# Patient Record
Sex: Male | Born: 1995 | Hispanic: No | Marital: Single
Health system: Midwestern US, Community
[De-identification: ages and names within clinical notes are randomized; demographics above are authoritative.]

## PROBLEM LIST (undated history)

## (undated) DIAGNOSIS — T7840XA Allergy, unspecified, initial encounter: Secondary | ICD-10-CM

## (undated) HISTORY — DX: Allergy, unspecified, initial encounter: T78.40XA

---
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
## 2015-10-21 IMAGING — CR DG LUMBAR SPINE 2-3V
3 series · 3 of 3 positions shown · non-contrast
Comparison: None.

CLINICAL DATA: Low back pain after fall.

EXAM:
LUMBAR SPINE - 2-3 VIEW

[AP]
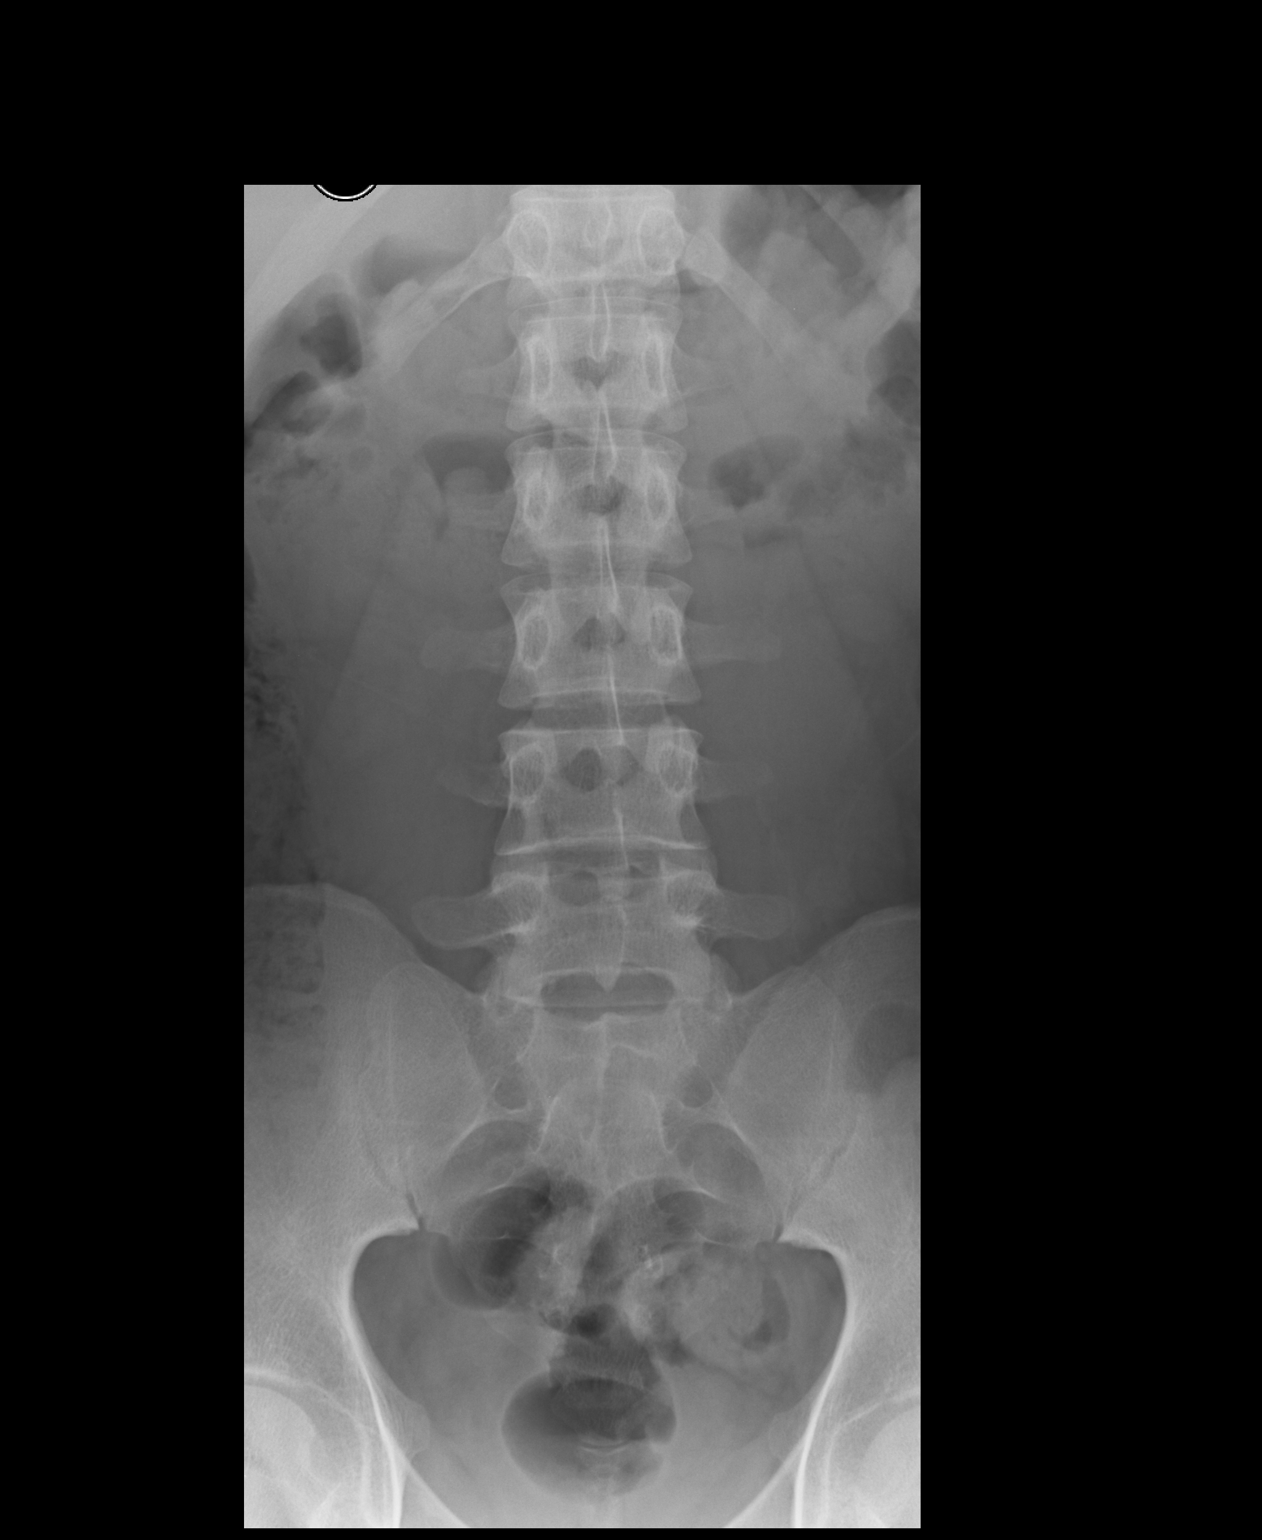

[lateral]
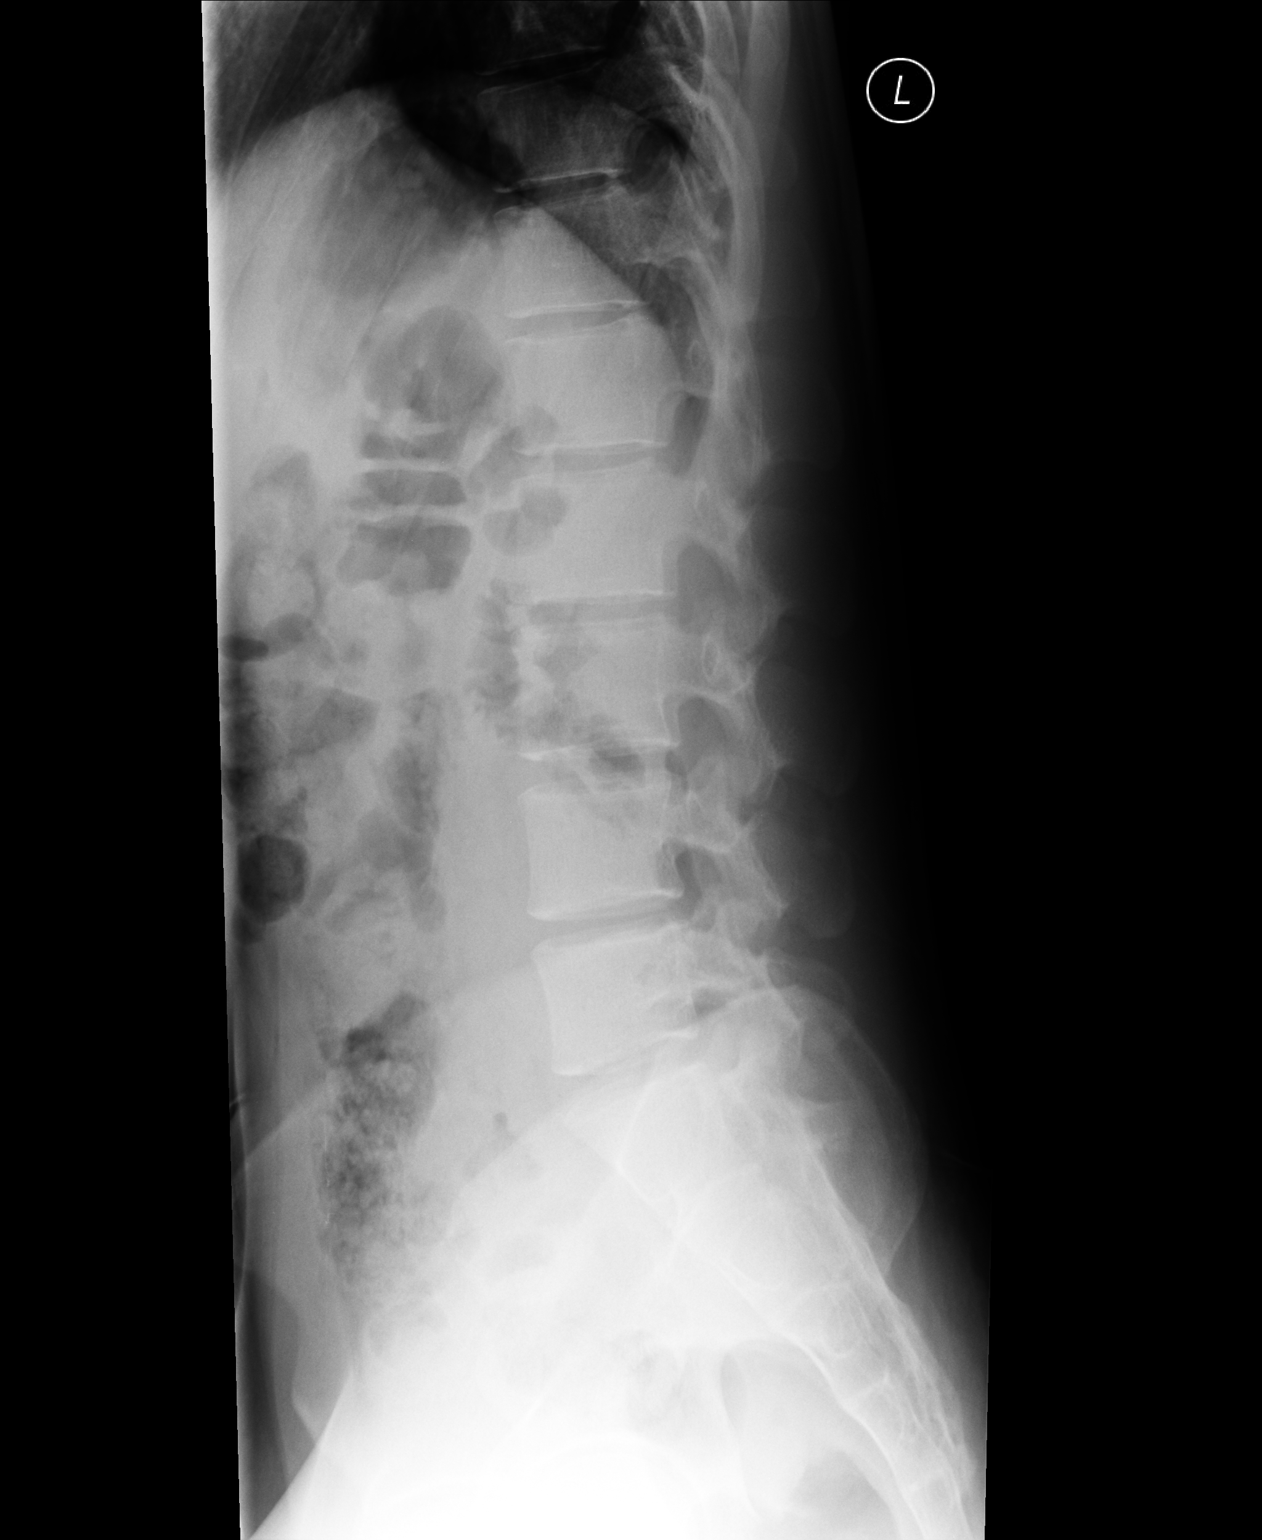

[l5 s1]
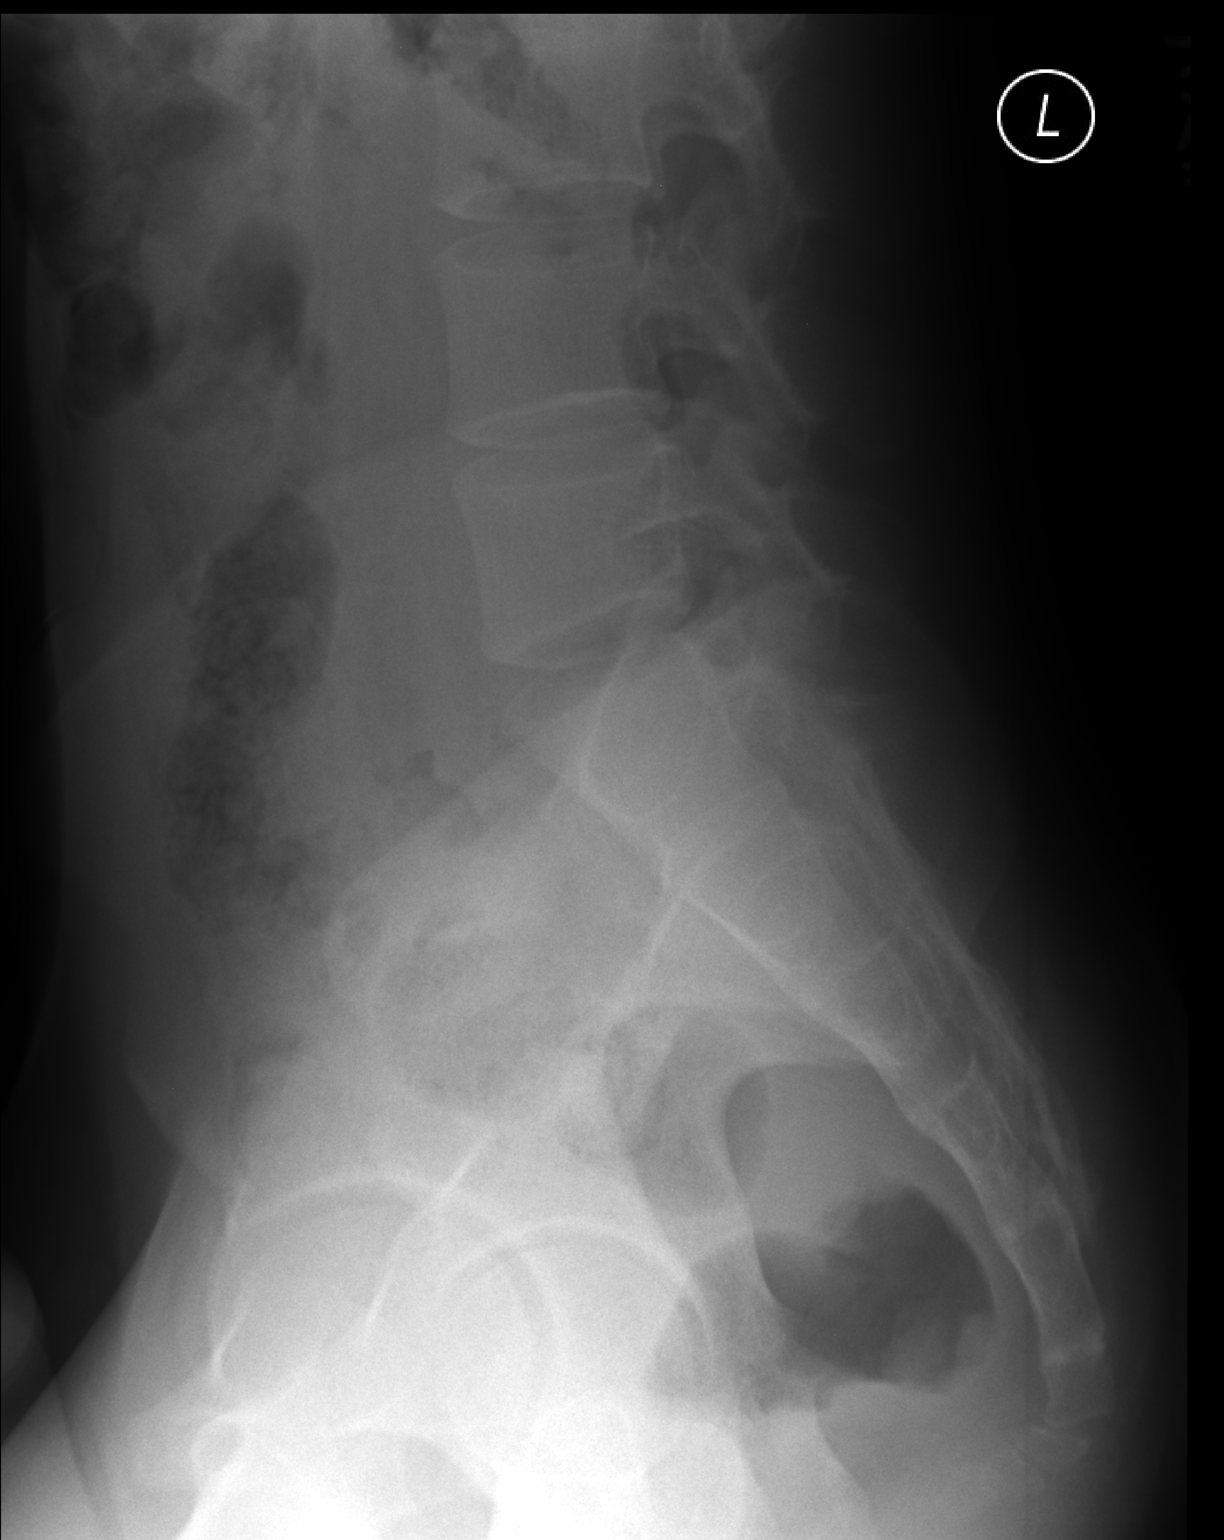

[3 of 3 positions shown; findings below may reference images not displayed]

FINDINGS: There is no evidence of lumbar spine fracture. Alignment is normal.
Intervertebral disc spaces are maintained.
IMPRESSION: Normal lumbar spine.

## 2018-06-26 ENCOUNTER — Inpatient Hospital Stay
Admit: 2018-06-26 | Discharge: 2018-07-05 | Disposition: A | Discharge status: Home / Self Care | Payer: PRIVATE HEALTH INSURANCE | Source: Other Acute Inpatient Hospital | Attending: Psychiatry | Admitting: Psychiatry

## 2018-06-26 DIAGNOSIS — F259 Schizoaffective disorder, unspecified: Secondary | ICD-10-CM

## 2018-06-26 MED ORDER — BENZTROPINE 2 MG TAB
2 mg | Freq: Two times a day (BID) | ORAL | Status: DC | PRN
Start: 2018-06-26 — End: 2018-07-05

## 2018-06-26 MED ORDER — TRAZODONE 50 MG TAB
50 mg | Freq: Every evening | ORAL | Status: DC | PRN
Start: 2018-06-26 — End: 2018-07-05
  Administered 2018-06-27 – 2018-07-05 (×3): via ORAL

## 2018-06-26 MED ORDER — HYDROXYZINE 25 MG TAB
25 mg | Freq: Four times a day (QID) | ORAL | Status: DC | PRN
Start: 2018-06-26 — End: 2018-07-05
  Administered 2018-06-26 – 2018-07-03 (×3): via ORAL

## 2018-06-26 MED ORDER — NICOTINE 21 MG/24 HR DAILY PATCH
21 mg/24 hr | Freq: Every day | TRANSDERMAL | Status: DC | PRN
Start: 2018-06-26 — End: 2018-07-05

## 2018-06-26 MED ORDER — BENZTROPINE 1 MG/ML IJ SOLN
1 mg/mL | Freq: Two times a day (BID) | INTRAMUSCULAR | Status: DC | PRN
Start: 2018-06-26 — End: 2018-07-05

## 2018-06-26 MED ORDER — ACETAMINOPHEN 325 MG TABLET
325 mg | ORAL | Status: DC | PRN
Start: 2018-06-26 — End: 2018-07-05
  Administered 2018-07-04: 23:00:00 via ORAL

## 2018-06-26 MED ORDER — MAGNESIUM HYDROXIDE 400 MG/5 ML ORAL SUSP
400 mg/5 mL | Freq: Every day | ORAL | Status: DC | PRN
Start: 2018-06-26 — End: 2018-07-05

## 2018-06-26 MED ORDER — WATER FOR INJECTION, STERILE INJECTION
20 mg/mL (final conc.) | Freq: Two times a day (BID) | INTRAMUSCULAR | Status: DC | PRN
Start: 2018-06-26 — End: 2018-07-05

## 2018-06-26 MED ORDER — LORAZEPAM 2 MG/ML IJ SOLN
2 mg/mL | INTRAMUSCULAR | Status: DC | PRN
Start: 2018-06-26 — End: 2018-07-05

## 2018-06-26 MED ORDER — QUETIAPINE 25 MG TAB
25 mg | Freq: Every evening | ORAL | Status: DC
Start: 2018-06-26 — End: 2018-06-28
  Administered 2018-06-27 – 2018-06-28 (×2): via ORAL

## 2018-06-26 MED ORDER — OLANZAPINE 5 MG TAB
5 mg | Freq: Four times a day (QID) | ORAL | Status: DC | PRN
Start: 2018-06-26 — End: 2018-07-05

## 2018-06-26 MED FILL — HYDROXYZINE 25 MG TAB: 25 mg | ORAL | Qty: 2
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
## 2018-06-26 NOTE — Consults (Addendum)
Hospitalist Progress Note  Kenneth HumblesSergey Eliel Dudding, MD  Answering service: [phone] OR [phone] from in house phone  Cell: [phone]      Date of Service:  06/26/2018 0  NAME:  Kenneth Hill  DOB:  20-Oct-1996  MRN:  [id number]  Admission date: 06/26/2018  6:37 AM    Admission Summary:   Kenneth Hill 22 y.o. male with the h/o schizoaffective disorder admitted to inpatient psychiatric unit after he presented with agitation and paranoya.    Interval history / Subjective:     Pleasant. Not agitated on my exam. Fairly good historian. Denies any medical problems.  Afebrile. No dysuria, cough, abdominal discomfort.     Assessment & Plan:   Schizoaffective disorder.  -Management per psychiatry  No medical problems based on chart review, physical exam and history.    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     Physical Examination:         Constitutional:  No acute distress, cooperative, pleasant??   Resp:  CTA bilaterally. No wheezing/rhonchi/rales. No accessory muscle use   CV:  Regular rhythm, normal rate, no murmurs, gallops, rubs    GI:  Soft, non distended, non tender. normoactive bowel sounds, no hepatosplenomegaly        Data Review:     Will sign off. Please, call with questions.             Kenneth HumblesSergey Cannie Muckle, MD
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
## 2018-06-26 NOTE — H&P (Signed)
INITIAL PSYCHIATRIC EVALUATION          IDENTIFICATION:    Patient Name  Kenneth Kenneth   Date of Birth 05-26-1996   CSN [id number]   Medical Record Number  [id number]      Age  22 y.o.   PCP None   Admit date:  06/26/2018    [facility] Number  312/01  [facility]   Date of Service  06/26/2018            HISTORY         REASON FOR HOSPITALIZATION:  CC:  Pt admitted under a temporary detention order (TDO) with severe psychosis and proving to be an imminent danger to self and others.    HISTORY OF PRESENT ILLNESS:    The patient, Kenneth Kenneth, is a 22 y.o.  [place] male with a past psychiatric history significant for Psychosis, who presents at this time with complaints of (and/or evidence of) the following emotional symptoms: agitation, delusions, paranoid behavior, anxiety and psychosis.  Additional symptomatology include anxiety, fearfulness, poor concentration and problem with medication.  The above symptoms have been present for a week. These symptoms are of high severity. These symptoms are constant and intermittent/ fleeting in nature.  The patient's condition has been precipitated by multiple psychosocial stressors .  Patient's condition made worse by continued illicit drug use as well as treatment noncompliance. UDS: +MJ, Amphetamines; BAL=0.   Pt is a [place] male and just returned from [place]. He presents very distractible. He has been on various anti-psychotics in the past and refuses to start one on. Reluctantly agreed to a trial of Seroquel.     ALLERGIES:   Allergies   Allergen Reactions   ??? Aspirin Swelling      MEDICATIONS PRIOR TO ADMISSION:   No medications prior to admission.      PAST MEDICAL HISTORY:   No past medical history on file.No past surgical history on file.   SOCIAL HISTORY: Per SW note   Social History     Socioeconomic History   ??? Marital status: SINGLE     Spouse name: Not on file   ??? Number of children: Not on file   ??? Years of education: Not on file    ??? Highest education level: Not on file   Occupational History   ??? Not on file   Social Needs   ??? Financial resource strain: Not on file   ??? Food insecurity:     Worry: Not on file     Inability: Not on file   ??? Transportation needs:     Medical: Not on file     Non-medical: Not on file   Tobacco Use   ??? Smoking status: Not on file   Substance and Sexual Activity   ??? Alcohol use: Not on file   ??? Drug use: Not on file   ??? Sexual activity: Not on file   Lifestyle   ??? Physical activity:     Days per week: Not on file     Minutes per session: Not on file   ??? Stress: Not on file   Relationships   ??? Social connections:     Talks on phone: Not on file     Gets together: Not on file     Attends religious service: Not on file     Active member of club or organization: Not on file     Attends meetings of clubs or organizations: Not  on file     Relationship status: Not on file   ??? Intimate partner violence:     Fear of current or ex partner: Not on file     Emotionally abused: Not on file     Physically abused: Not on file     Forced sexual activity: Not on file   Other Topics Concern   ??? Not on file   Social History Narrative   ??? Not on file      FAMILY HISTORY: History reviewed. No pertinent family history.   No family history on file.    REVIEW OF SYSTEMS:   Psychological ROS: positive for - anxiety, concentration difficulties and sleep disturbances  negative for - disorientation, hallucinations or hostility  Pertinent items are noted in the History of Present Illness.  All other Systems reviewed and are considered negative.           MENTAL STATUS EXAM & VITALS     MENTAL STATUS EXAM (MSE):    MSE FINDINGS ARE WITHIN NORMAL LIMITS (WNL) UNLESS OTHERWISE STATED BELOW. ( ALL OF THE BELOW CATEGORIES OF THE MSE HAVE BEEN REVIEWED (reviewed 06/26/2018) AND UPDATED AS DEEMED APPROPRIATE )  General Presentation age appropriate and casually dressed, cooperative   Orientation oriented to time, place and person    Vital Signs  See below (reviewed 06/26/2018); Vital Signs (BP, Pulse, & Temp) are within normal limits if not listed below.   Gait and Station Stable/steady, no ataxia   Musculoskeletal System No extrapyramidal symptoms (EPS); no abnormal muscular movements or Tardive Dyskinesia (TD); muscle strength and tone are within normal limits   Language No aphasia or dysarthria   Speech:  hypoverbal   Thought Processes Illogical; normal rate of thoughts; poor abstract reasoning/computation   Thought Associations circumstantial   Thought Content paranoid delusions and preoccupations   Suicidal Ideations none   Homicidal Ideations none   Mood:  anxious    Affect:  sad   Memory recent  good   Memory remote:  good   Concentration/Attention:  distractable   Fund of Knowledge average   Insight:  limited   Reliability poor   Judgment:  limited          VITALS:     Patient Vitals for the past 24 hrs:   Temp Pulse Resp BP SpO2   06/26/18 0740 97.9 ??F (36.6 ??C) 100 16 100/60 96 %     Wt Readings from Last 3 Encounters:   06/26/18 63.5 kg (140 lb)     Temp Readings from Last 3 Encounters:   06/26/18 97.9 ??F (36.6 ??C)     BP Readings from Last 3 Encounters:   06/26/18 100/60     Pulse Readings from Last 3 Encounters:   06/26/18 100            DATA     LABORATORY DATA:  Labs Reviewed - No data to display  No results found for any previous visit.        RADIOLOGY REPORTS:  No results found for this or any previous visit.No results found.           MEDICATIONS       ALL MEDICATIONS  Current Facility-Administered Medications   Medication Dose Route Frequency   ??? ziprasidone (GEODON) 20 mg in sterile water (preservative free) 1 mL injection  20 mg IntraMUSCular BID PRN   ??? OLANZapine (ZyPREXA) tablet 5 mg  5 mg Oral Q6H PRN   ??? benztropine (COGENTIN)  tablet 2 mg  2 mg Oral BID PRN   ??? benztropine (COGENTIN) injection 2 mg  2 mg IntraMUSCular BID PRN   ??? LORazepam (ATIVAN) injection 2 mg  2 mg IntraMUSCular Q4H PRN    ??? acetaminophen (TYLENOL) tablet 650 mg  650 mg Oral Q4H PRN   ??? magnesium hydroxide (MILK OF MAGNESIA) 400 mg/5 mL oral suspension 30 mL  30 mL Oral DAILY PRN   ??? nicotine (NICODERM CQ) 21 mg/24 hr patch 1 Patch  1 Patch TransDERmal DAILY PRN   ??? hydrOXYzine HCl (ATARAX) tablet 50 mg  50 mg Oral Q6H PRN   ??? traZODone (DESYREL) tablet 50 mg  50 mg Oral QHS PRN   ??? QUEtiapine (SEROquel) tablet 50 mg  50 mg Oral QHS      SCHEDULED MEDICATIONS  Current Facility-Administered Medications   Medication Dose Route Frequency   ??? QUEtiapine (SEROquel) tablet 50 mg  50 mg Oral QHS              ASSESSMENT & PLAN        The patient, Kenneth Kenneth, is a 22 y.o.  male who presents at this time for treatment of the following diagnoses:  Patient Active Hospital Problem List:   Schizoaffective disorder (HCC) (06/26/2018)    Assessment: Presents distractible, denies current AVH, paranoid delusions reported at the time of ER eval.    Plan: Agreed to a trial of Seroquel 50 mg qhs          I will continue to monitor blood levels (Depakote, Tegretol, lithium, clozapine---a drug with a narrow therapeutic index= NTI) and associated labs for drug therapy implemented that require intense monitoring for toxicity as deemed appropriate based on current medication side effects and pharmacodynamically determined drug 1/2 lives.         A coordinated, multidisplinary treatment team (includes the nurse, unit pharmcist, Administrator) round was conducted for this initial evaluation with the patient present.     The following regarding medications was addressed during rounds with patient:   the risks and benefits of the proposed medication. The patient was given the opportunity to ask questions. Informed consent given to the use of the above medications.     I will continue to adjust psychiatric and non-psychiatric medications (see above "medication" section and orders section for details) as deemed  appropriate & based upon diagnoses and response to treatment.     I have reviewed admission (and previous/old) labs and medical tests in the EHR and or transferring hospital documents. I will continue to order blood tests/labs and diagnostic tests as deemed appropriate and review results as they become available (see orders for details).    I have reviewed old psychiatric and medical records available in the EHR. I Will order additional psychiatric records from other institutions to further elucidate the nature of patient's psychopathology and review once available.    I will gather additional collateral information from friends, family and o/p treatment team to further elucidate the nature of patient's psychopathology and baselline level of psychiatric functioning.      ESTIMATED LENGTH OF STAY:    4-5 days       STRENGTHS:  Access to housing/residential stability, Interpersonal/supportive relationships (family, friends, peers) and Awareness of Substance abuse issues  SIGNED:    Elby Showers, MD  06/26/2018
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
## 2018-06-26 NOTE — Consults (Signed)
Consults by  Kenneth Hill, Kenneth Ghrist, MD at 06/26/18 1101                Author: Holli Hill, Kenneth Supak, MD  Service: Internal Medicine  Author Type: Physician       Filed: 06/26/18 1544  Date of Service: 06/26/18 1101  Status: Addendum          Editor: Kenneth Hill, Kenneth Mccready, MD (Physician)          Related Notes: Original Note by Kenneth Hill, Kenneth Zellars, MD (Physician) filed at 06/26/18 1104            Consult Orders        1. IP CONSULT TO INTERNAL MEDICINE [[id number]][[id number]] ordered by Kenneth Hill, Kenneth L, MD at 06/26/18 [phone]                                                                                                      Hospitalist Progress  Note   Kenneth HumblesSergey Yazmen Briones, MD   Answering service: [phone] OR [phone] from in house phone   Cell: [phone]         Date of Service:  06/26/2018 0   NAME:  Kenneth Hill   DOB:  1996/05/02   MRN:  [id number]   Admission date: 06/26/2018  6:37 AM        Admission Summary:        Kenneth Hill 22 y.o.  male with the h/o schizoaffective disorder admitted to inpatient psychiatric unit after he presented with agitation and paranoya.      Interval history / Subjective:          Pleasant. Not agitated on my exam. Fairly good historian. Denies any medical problems.   Afebrile. No dysuria, cough, abdominal discomfort.          Assessment & Plan:     Schizoaffective disorder.   -Management per psychiatry   No medical problems based on chart review, physical exam and history.         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         Physical Examination:  Constitutional:   No acute distress, cooperative, pleasant??      Resp:   CTA bilaterally. No wheezing/rhonchi/rales. No accessory muscle use     CV:   Regular rhythm, normal rate, no murmurs, gallops, rubs      GI:   Soft, non distended, non tender. normoactive bowel sounds, no hepatosplenomegaly              Data Review:        Will sign off. Please, call with questions.              Kenneth Humbles, MD

## 2018-06-26 NOTE — H&P (Signed)
INITIAL PSYCHIATRIC EVALUATION            IDENTIFICATION:    Patient Name  Kenneth Hill   Date of Birth December 16, 1996   CSN [id number]   Medical Record Number  [id number]      Age  22 y.o.   PCP None   Admit date:  06/26/2018    Room Number  312/01  @ [facility]   Date of Service  06/26/2018            HISTORY         REASON FOR HOSPITALIZATION:  CC:  Pt admitted under a temporary detention order (TDO) with severe psychosis and proving to be an imminent danger to self and others.    HISTORY OF PRESENT ILLNESS:    The patient, Kenneth Hill, is a 22 y.o.  [place] male with a past psychiatric history significant for Psychosis, who presents at this time with complaints of (and/or evidence of) the following emotional symptoms: agitation, delusions, paranoid behavior, anxiety and psychosis.  Additional symptomatology include anxiety, fearfulness, poor concentration and problem with medication.  The above symptoms have been present for a week. These symptoms are of high severity. These symptoms are constant and intermittent/ fleeting in nature.  The patient's condition has been precipitated by multiple psychosocial stressors .  Patient's condition made worse by continued illicit drug use as well as treatment noncompliance. UDS: +MJ, Amphetamines; BAL=0.   Pt is a [place] and just returned from [place]. He presents very distractible. He has been on various anti-psychotics in the past and refuses to start one on. Reluctantly agreed to a trial of Seroquel.     ALLERGIES:   Allergies   Allergen Reactions   ??? Aspirin Swelling      MEDICATIONS PRIOR TO ADMISSION:   No medications prior to admission.      PAST MEDICAL HISTORY:   No past medical history on file.No past surgical history on file.   SOCIAL HISTORY: Per SW note   Social History     Socioeconomic History   ??? Marital status: SINGLE     Spouse name: Not on file   ??? Number of children: Not on file   ??? Years of education: Not on file   ??? Highest  education level: Not on file   Occupational History   ??? Not on file   Social Needs   ??? Financial resource strain: Not on file   ??? Food insecurity:     Worry: Not on file     Inability: Not on file   ??? Transportation needs:     Medical: Not on file     Non-medical: Not on file   Tobacco Use   ??? Smoking status: Not on file   Substance and Sexual Activity   ??? Alcohol use: Not on file   ??? Drug use: Not on file   ??? Sexual activity: Not on file   Lifestyle   ??? Physical activity:     Days per week: Not on file     Minutes per session: Not on file   ??? Stress: Not on file   Relationships   ??? Social connections:     Talks on phone: Not on file     Gets together: Not on file     Attends religious service: Not on file     Active member of club or organization: Not on file     Attends meetings of clubs or  organizations: Not on file     Relationship status: Not on file   ??? Intimate partner violence:     Fear of current or ex partner: Not on file     Emotionally abused: Not on file     Physically abused: Not on file     Forced sexual activity: Not on file   Other Topics Concern   ??? Not on file   Social History Narrative   ??? Not on file      FAMILY HISTORY: History reviewed. No pertinent family history.   No family history on file.    REVIEW OF SYSTEMS:   Psychological ROS: positive for - anxiety, concentration difficulties and sleep disturbances  negative for - disorientation, hallucinations or hostility  Pertinent items are noted in the History of Present Illness.  All other Systems reviewed and are considered negative.           MENTAL STATUS EXAM & VITALS     MENTAL STATUS EXAM (MSE):    MSE FINDINGS ARE WITHIN NORMAL LIMITS (WNL) UNLESS OTHERWISE STATED BELOW. ( ALL OF THE BELOW CATEGORIES OF THE MSE HAVE BEEN REVIEWED (reviewed 06/26/2018) AND UPDATED AS DEEMED APPROPRIATE )  General Presentation age appropriate and casually dressed, cooperative   Orientation oriented to time, place and person   Vital Signs  See below (reviewed  06/26/2018); Vital Signs (BP, Pulse, & Temp) are within normal limits if not listed below.   Gait and Station Stable/steady, no ataxia   Musculoskeletal System No extrapyramidal symptoms (EPS); no abnormal muscular movements or Tardive Dyskinesia (TD); muscle strength and tone are within normal limits   Language No aphasia or dysarthria   Speech:  hypoverbal   Thought Processes Illogical; normal rate of thoughts; poor abstract reasoning/computation   Thought Associations circumstantial   Thought Content paranoid delusions and preoccupations   Suicidal Ideations none   Homicidal Ideations none   Mood:  anxious    Affect:  sad   Memory recent  good   Memory remote:  good   Concentration/Attention:  distractable   Fund of Knowledge average   Insight:  limited   Reliability poor   Judgment:  limited          VITALS:     Patient Vitals for the past 24 hrs:   Temp Pulse Resp BP SpO2   06/26/18 0740 97.9 ??F (36.6 ??C) 100 16 100/60 96 %     Wt Readings from Last 3 Encounters:   06/26/18 63.5 kg (140 lb)     Temp Readings from Last 3 Encounters:   06/26/18 97.9 ??F (36.6 ??C)     BP Readings from Last 3 Encounters:   06/26/18 100/60     Pulse Readings from Last 3 Encounters:   06/26/18 100            DATA     LABORATORY DATA:  Labs Reviewed - No data to display  No results found for any previous visit.        RADIOLOGY REPORTS:  No results found for this or any previous visit.No results found.           MEDICATIONS       ALL MEDICATIONS  Current Facility-Administered Medications   Medication Dose Route Frequency   ??? ziprasidone (GEODON) 20 mg in sterile water (preservative free) 1 mL injection  20 mg IntraMUSCular BID PRN   ??? OLANZapine (ZyPREXA) tablet 5 mg  5 mg Oral Q6H PRN   ???  benztropine (COGENTIN) tablet 2 mg  2 mg Oral BID PRN   ??? benztropine (COGENTIN) injection 2 mg  2 mg IntraMUSCular BID PRN   ??? LORazepam (ATIVAN) injection 2 mg  2 mg IntraMUSCular Q4H PRN   ??? acetaminophen (TYLENOL) tablet 650 mg  650 mg Oral Q4H PRN    ??? magnesium hydroxide (MILK OF MAGNESIA) 400 mg/5 mL oral suspension 30 mL  30 mL Oral DAILY PRN   ??? nicotine (NICODERM CQ) 21 mg/24 hr patch 1 Patch  1 Patch TransDERmal DAILY PRN   ??? hydrOXYzine HCl (ATARAX) tablet 50 mg  50 mg Oral Q6H PRN   ??? traZODone (DESYREL) tablet 50 mg  50 mg Oral QHS PRN   ??? QUEtiapine (SEROquel) tablet 50 mg  50 mg Oral QHS      SCHEDULED MEDICATIONS  Current Facility-Administered Medications   Medication Dose Route Frequency   ??? QUEtiapine (SEROquel) tablet 50 mg  50 mg Oral QHS                ASSESSMENT & PLAN        The patient, Kenneth Hill, is a 22 y.o.  male who presents at this time for treatment of the following diagnoses:  Patient Active Hospital Problem List:   Schizoaffective disorder (HCC) (06/26/2018)    Assessment: Presents distractible, denies current AVH, paranoid delusions reported at the time of ER eval.    Plan: Agreed to a trial of Seroquel 50 mg qhs          I will continue to monitor blood levels (Depakote, Tegretol, lithium, clozapine---a drug with a narrow therapeutic index= NTI) and associated labs for drug therapy implemented that require intense monitoring for toxicity as deemed appropriate based on current medication side effects and pharmacodynamically determined drug 1/2 lives.         A coordinated, multidisplinary treatment team (includes the nurse, unit pharmcist, Administratorsocial worker and writer) round was conducted for this initial evaluation with the patient present.     The following regarding medications was addressed during rounds with patient:   the risks and benefits of the proposed medication. The patient was given the opportunity to ask questions. Informed consent given to the use of the above medications.     I will continue to adjust psychiatric and non-psychiatric medications (see above "medication" section and orders section for details) as deemed appropriate & based upon diagnoses and response to treatment.     I have reviewed admission (and  previous/old) labs and medical tests in the EHR and or transferring hospital documents. I will continue to order blood tests/labs and diagnostic tests as deemed appropriate and review results as they become available (see orders for details).    I have reviewed old psychiatric and medical records available in the EHR. I Will order additional psychiatric records from other institutions to further elucidate the nature of patient's psychopathology and review once available.    I will gather additional collateral information from friends, family and o/p treatment team to further elucidate the nature of patient's psychopathology and baselline level of psychiatric functioning.      ESTIMATED LENGTH OF STAY:    4-5 days       STRENGTHS:  Access to housing/residential stability, Interpersonal/supportive relationships (family, friends, peers) and Awareness of Substance abuse issues  SIGNED:    Elby Showers, MD  06/26/2018

## 2018-06-27 LAB — LIPID PANEL
CHOL/HDL Ratio: 4.4 (ref 0.0–5.0)
Chol/HDL Ratio: 4.4 (ref 0.0–5.0)
Cholesterol, Total: 264 MG/DL — ABNORMAL HIGH (ref <200)
Cholesterol, total: 264 MG/DL — ABNORMAL HIGH (ref <200)
HDL Cholesterol: 60 MG/DL
HDL: 60 MG/DL
LDL Calculated: 183.2 MG/DL — ABNORMAL HIGH (ref 0–100)
LDL, calculated: 183.2 MG/DL — ABNORMAL HIGH (ref 0–100)
Triglyceride: 104 MG/DL (ref <150)
Triglycerides: 104 MG/DL (ref <150)
VLDL Cholesterol Calculated: 20.8 MG/DL
VLDL, calculated: 20.8 MG/DL

## 2018-06-27 LAB — GLUCOSE, FASTING
GLUCOSE, FASTING,GF: 98 MG/DL (ref 65–100)
Glucose: 98 MG/DL (ref 65–100)

## 2018-06-27 LAB — TSH 3RD GENERATION
TSH: 6.43 u[IU]/mL — ABNORMAL HIGH (ref 0.36–3.74)
TSH: 6.43 u[IU]/mL — ABNORMAL HIGH (ref 0.36–3.74)

## 2018-06-27 MED FILL — QUETIAPINE 25 MG TAB: 25 mg | ORAL | Qty: 2

## 2018-06-27 MED FILL — HYDROXYZINE 25 MG TAB: 25 mg | ORAL | Qty: 2

## 2018-06-27 MED FILL — NICOTINE 21 MG/24 HR DAILY PATCH: 21 mg/24 hr | TRANSDERMAL | Qty: 1

## 2018-06-27 MED FILL — TRAZODONE 50 MG TAB: 50 mg | ORAL | Qty: 1

## 2018-06-27 NOTE — Progress Notes (Addendum)
0700  Report received from Emma RN    1025  Pt has been out in the milieu.  He ate breakfast.  He had some interactions with others.  He does not know his mother's phone number.  Pt tried to call his brother but stated the number has been disconnected.   Pt denies mental health issues.  He had stated last evening that the reason he was hospitalized in [place] was because he was eating raw liver.  He stated "it is good for you, ya know."  Pt stated they thought he was crazy.    Pt stated his mother thinks he is "crazy" too.   Pt stated he did not sleep last night.  He was informed he slept 4 hours he disputed this stating he was awake all night.      1329  Pt seen by psychiatry.  He has been somewhat withdrawn today.      1822  Pt has been in and out of his room.  Limited interactions with others,  Pt spoke to his brother on the phone.  He asked about his mother coming to the hearing tomorrow.  He was deferred to treatment team.

## 2018-06-27 NOTE — Behavioral Health Treatment Team (Signed)
Patient is calm and cooperative. He appears anxious. Poor eye contact during interactions. Isolative to his room. Describes his day as being "fine". He denies any current feelings of SI or HI. Denies having any auditory or visual hallucinations. He was hesitant to take his bedtime medications. When writer explained to him it could help with sleep, he was more willing to accept his scheduled dose of Seroquel. Currently resting in bed with his eyes closed. Q 15 minute safety checks continue. Tasha Washington BSN, RN

## 2018-06-27 NOTE — Behavioral Health Treatment Team (Signed)
19:00 - Received report from Regina, RN.    19:30 - Patient up in dayroom interacting with peers. Patient denies SI/HI and AH/VH. Patient states he is doing "okay". Will continue to monitor for safety per unit policy.    22:00 - Patient is compliant with scheduled medication. He discussed his seroquel with a peer and later came back to med room and stated he does not want to take it any more.  Writer instructed patient to speak with his doctor.    00:00 - Patient up wanting to change rooms. Informed patient we had no other rooms available. Patient stated he could not go to sleep that the seroquel messed with his head.    00:20 - Patient given atarax for anxiety.    02:00 - Patient resting in bed with eyes opened.    04:00 - Patient resting quietly in bed with eyes closed. NAD noted.    06:00 - Hourly rounds were done and continue. Patient slept approximately 4 hours this shift.

## 2018-06-27 NOTE — Behavioral Health Treatment Team (Signed)
PSYCHIATRIC PROGRESS NOTE          IDENTIFICATION:    Patient Name  Kenneth Hill   Date of Birth Apr 26, 1996   CSN [id number]   Medical Record Number  [id number]      Age  22 y.o.   PCP None   Admit date:  06/26/2018    Room Number  312/01  @ [facility]   Date of Service  06/27/2018            HISTORY         REASON FOR HOSPITALIZATION:  CC:  Pt admitted under a temporary detention order (TDO) with severe psychosis and proving to be an imminent danger to self and others.    HISTORY OF PRESENT ILLNESS:    The patient, Kenneth Hill, is a 22 y.o.  [place] male with a past psychiatric history significant for Psychosis, who presents at this time with complaints of (and/or evidence of) the following emotional symptoms: agitation, delusions, paranoid behavior, anxiety and psychosis.  Additional symptomatology include anxiety, fearfulness, poor concentration and problem with medication.  The above symptoms have been present for a week. These symptoms are of high severity. These symptoms are constant and intermittent/ fleeting in nature.  The patient's condition has been precipitated by multiple psychosocial stressors .  Patient's condition made worse by continued illicit drug use as well as treatment noncompliance. UDS: +MJ, Amphetamines; BAL=0.   Pt is a [place] male and just returned from [place]. He presents very distractible. He has been on various anti-psychotics in the past and refuses to start one on. Reluctantly agreed to a trial of Seroquel.  7/7-Appears less anxious today but is very doubtful about taking Psych meds. Still has delusional thoughts. Slept 4 hrs. Prn used Atarax. Mom visited yesterday.   ALLERGIES:   Allergies   Allergen Reactions   ??? Aspirin Swelling      MEDICATIONS PRIOR TO ADMISSION:   No medications prior to admission.      PAST MEDICAL HISTORY:   No past medical history on file.No past surgical history on file.   SOCIAL HISTORY: Per SW note   Social History      Socioeconomic History   ??? Marital status: SINGLE     Spouse name: Not on file   ??? Number of children: Not on file   ??? Years of education: Not on file   ??? Highest education level: Not on file   Occupational History   ??? Not on file   Social Needs   ??? Financial resource strain: Not on file   ??? Food insecurity:     Worry: Not on file     Inability: Not on file   ??? Transportation needs:     Medical: Not on file     Non-medical: Not on file   Tobacco Use   ??? Smoking status: Not on file   Substance and Sexual Activity   ??? Alcohol use: Not on file   ??? Drug use: Not on file   ??? Sexual activity: Not on file   Lifestyle   ??? Physical activity:     Days per week: Not on file     Minutes per session: Not on file   ??? Stress: Not on file   Relationships   ??? Social connections:     Talks on phone: Not on file     Gets together: Not on file     Attends religious service: Not on file  Active member of club or organization: Not on file     Attends meetings of clubs or organizations: Not on file     Relationship status: Not on file   ??? Intimate partner violence:     Fear of current or ex partner: Not on file     Emotionally abused: Not on file     Physically abused: Not on file     Forced sexual activity: Not on file   Other Topics Concern   ??? Not on file   Social History Narrative   ??? Not on file      FAMILY HISTORY: History reviewed. No pertinent family history.   No family history on file.    REVIEW OF SYSTEMS:   Psychological ROS: positive for - anxiety, concentration difficulties and sleep disturbances  negative for - disorientation, hallucinations or hostility  Pertinent items are noted in the History of Present Illness.  All other Systems reviewed and are considered negative.           MENTAL STATUS EXAM & VITALS     MENTAL STATUS EXAM (MSE):    MSE FINDINGS ARE WITHIN NORMAL LIMITS (WNL) UNLESS OTHERWISE STATED BELOW. ( ALL OF THE BELOW CATEGORIES OF THE MSE HAVE BEEN REVIEWED (reviewed  06/27/2018) AND UPDATED AS DEEMED APPROPRIATE )  General Presentation age appropriate and casually dressed, cooperative   Orientation oriented to time, place and person   Vital Signs  See below (reviewed 06/27/2018); Vital Signs (BP, Pulse, & Temp) are within normal limits if not listed below.   Gait and Station Stable/steady, no ataxia   Musculoskeletal System No extrapyramidal symptoms (EPS); no abnormal muscular movements or Tardive Dyskinesia (TD); muscle strength and tone are within normal limits   Language No aphasia or dysarthria   Speech:  hypoverbal   Thought Processes Illogical; normal rate of thoughts; poor abstract reasoning/computation   Thought Associations circumstantial   Thought Content paranoid delusions and preoccupations   Suicidal Ideations none   Homicidal Ideations none   Mood:  anxious    Affect:  sad   Memory recent  good   Memory remote:  good   Concentration/Attention:  distractable   Fund of Knowledge average   Insight:  limited   Reliability poor   Judgment:  limited          VITALS:     Patient Vitals for the past 24 hrs:   Temp Pulse Resp BP SpO2   06/27/18 1015 99 ??F (37.2 ??C) 83 18 114/67 99 %   06/26/18 1930 98.1 ??F (36.7 ??C) 81 18 133/80 99 %     Wt Readings from Last 3 Encounters:   06/26/18 63.5 kg (140 lb)     Temp Readings from Last 3 Encounters:   06/27/18 99 ??F (37.2 ??C)     BP Readings from Last 3 Encounters:   06/27/18 114/67     Pulse Readings from Last 3 Encounters:   06/27/18 83            DATA     LABORATORY DATA:  Labs Reviewed   TSH 3RD GENERATION - Abnormal; Notable for the following components:       Result Value    TSH 6.43 (*)     All other components within normal limits   LIPID PANEL - Abnormal; Notable for the following components:    Cholesterol, total 264 (*)     LDL, calculated 183.2 (*)     All other components within  normal limits   GLUCOSE, FASTING     Admission on 06/26/2018   Component Date Value Ref Range Status    ??? Glucose 06/27/2018 98  65 - 100 MG/DL Final   ??? TSH 16/10/960407/06/2018 6.43* 0.36 - 3.74 uIU/mL Final   ??? LIPID PROFILE 06/27/2018        Final   ??? Cholesterol, total 06/27/2018 264* <200 MG/DL Final   ??? Triglyceride 06/27/2018 104  <150 MG/DL Final   ??? HDL Cholesterol 06/27/2018 60  MG/DL Final   ??? LDL, calculated 06/27/2018 183.2* 0 - 100 MG/DL Final   ??? VLDL, calculated 06/27/2018 20.8  MG/DL Final   ??? CHOL/HDL Ratio 06/27/2018 4.4  0.0 - 5.0   Final        RADIOLOGY REPORTS:  No results found for this or any previous visit.No results found.           MEDICATIONS       ALL MEDICATIONS  Current Facility-Administered Medications   Medication Dose Route Frequency   ??? ziprasidone (GEODON) 20 mg in sterile water (preservative free) 1 mL injection  20 mg IntraMUSCular BID PRN   ??? OLANZapine (ZyPREXA) tablet 5 mg  5 mg Oral Q6H PRN   ??? benztropine (COGENTIN) tablet 2 mg  2 mg Oral BID PRN   ??? benztropine (COGENTIN) injection 2 mg  2 mg IntraMUSCular BID PRN   ??? LORazepam (ATIVAN) injection 2 mg  2 mg IntraMUSCular Q4H PRN   ??? acetaminophen (TYLENOL) tablet 650 mg  650 mg Oral Q4H PRN   ??? magnesium hydroxide (MILK OF MAGNESIA) 400 mg/5 mL oral suspension 30 mL  30 mL Oral DAILY PRN   ??? nicotine (NICODERM CQ) 21 mg/24 hr patch 1 Patch  1 Patch TransDERmal DAILY PRN   ??? hydrOXYzine HCl (ATARAX) tablet 50 mg  50 mg Oral Q6H PRN   ??? traZODone (DESYREL) tablet 50 mg  50 mg Oral QHS PRN   ??? QUEtiapine (SEROquel) tablet 50 mg  50 mg Oral QHS      SCHEDULED MEDICATIONS  Current Facility-Administered Medications   Medication Dose Route Frequency   ??? QUEtiapine (SEROquel) tablet 50 mg  50 mg Oral QHS              ASSESSMENT & PLAN        The patient, Kenneth Hill, is a 22 y.o.  male who presents at this time for treatment of the following diagnoses:  Patient Active Hospital Problem List:   Schizoaffective disorder (HCC) (06/26/2018)    Assessment: Presents distractible, denies current AVH, paranoid  delusions reported at the time of ER eval.    Plan: Agreed to a trial of Seroquel 50 mg qhs          I will continue to monitor blood levels (Depakote, Tegretol, lithium, clozapine---a drug with a narrow therapeutic index= NTI) and associated labs for drug therapy implemented that require intense monitoring for toxicity as deemed appropriate based on current medication side effects and pharmacodynamically determined drug 1/2 lives.         A coordinated, multidisplinary treatment team (includes the nurse, unit pharmcist, Administratorsocial worker and writer) round was conducted for this initial evaluation with the patient present.     The following regarding medications was addressed during rounds with patient:   the risks and benefits of the proposed medication. The patient was given the opportunity to ask questions. Informed consent given to the use of the above medications.  I will continue to adjust psychiatric and non-psychiatric medications (see above "medication" section and orders section for details) as deemed appropriate & based upon diagnoses and response to treatment.     I have reviewed admission (and previous/old) labs and medical tests in the EHR and or transferring hospital documents. I will continue to order blood tests/labs and diagnostic tests as deemed appropriate and review results as they become available (see orders for details).    I have reviewed old psychiatric and medical records available in the EHR. I Will order additional psychiatric records from other institutions to further elucidate the nature of patient's psychopathology and review once available.    I will gather additional collateral information from friends, family and o/p treatment team to further elucidate the nature of patient's psychopathology and baselline level of psychiatric functioning.      ESTIMATED LENGTH OF STAY:    4-5 days       STRENGTHS:  Access to housing/residential stability, Interpersonal/supportive  relationships (family, friends, peers) and Awareness of Substance abuse issues                                        SIGNED:    Elby Showers, MD  06/27/2018

## 2018-06-27 NOTE — Progress Notes (Signed)
 0700  Report received from [facility]    1025  Pt has been out in the milieu.  He ate breakfast.  He had some interactions with others.  He does not know his mother's phone number.  Pt tried to call his brother but stated the number has been disconnected.   Pt denies mental health issues.  He had stated last evening that the reason he was hospitalized in [place] was because he was eating raw liver.  He stated it is good for you, ya know.  Pt stated they thought he was crazy.    Pt stated his mother thinks he is crazy too.   Pt stated he did not sleep last night.  He was informed he slept 4 hours he disputed this stating he was awake all night.      1329  Pt seen by psychiatry.  He has been somewhat withdrawn today.      1822  Pt has been in and out of his room.  Limited interactions with others,  Pt spoke to his brother on the phone.  He asked about his mother coming to the hearing tomorrow.  He was deferred to treatment team.

## 2018-06-27 NOTE — Behavioral Health Treatment Team (Signed)
 Patient is calm and cooperative. He appears anxious. Poor eye contact during interactions. Isolative to his room. Describes his day as being fine. He denies any current feelings of SI or HI. Denies having any auditory or visual hallucinations. He was hesitant to take his bedtime medications. When writer explained to him it could help with sleep, he was more willing to accept his scheduled dose of Seroquel. Currently resting in bed with his eyes closed. Q 15 minute safety checks continue. Tasha Washington  BSN, RN

## 2018-06-27 NOTE — Behavioral Health Treatment Team (Signed)
 19:00 - Received report from [place], [place].    19:30 - Patient up in dayroom interacting with peers. Patient denies SI/HI and AH/VH. Patient states he is doing okay. Will continue to monitor for safety per unit policy.    22:00 - Patient is compliant with scheduled medication. He discussed his seroquel with a peer and later came back to med room and stated he does not want to take it any more.  Writer instructed patient to speak with his doctor.    00:00 - Patient up wanting to change rooms. Informed patient we had no other rooms available. Patient stated he could not go to sleep that the seroquel messed with his head.    00:20 - Patient given atarax for anxiety.    02:00 - Patient resting in bed with eyes opened.    04:00 - Patient resting quietly in bed with eyes closed. NAD noted.    06:00 - Hourly rounds were done and continue. Patient slept approximately 4 hours this shift.

## 2018-06-27 NOTE — Behavioral Health Treatment Team (Signed)
PSYCHIATRIC PROGRESS NOTE            IDENTIFICATION:    Patient Name  Kenneth Hill   Date of Birth January 05, 1996   CSN [id number]   Medical Record Number  [id number]      Age  22 y.o.   PCP None   Admit date:  06/26/2018    Room Number  312/01  @ [facility]   Date of Service  06/27/2018            HISTORY         REASON FOR HOSPITALIZATION:  CC:  Pt admitted under a temporary detention order (TDO) with severe psychosis and proving to be an imminent danger to self and others.    HISTORY OF PRESENT ILLNESS:    The patient, Kenneth Hill, is a 22 y.o.  [place] male with a past psychiatric history significant for Psychosis, who presents at this time with complaints of (and/or evidence of) the following emotional symptoms: agitation, delusions, paranoid behavior, anxiety and psychosis.  Additional symptomatology include anxiety, fearfulness, poor concentration and problem with medication.  The above symptoms have been present for a week. These symptoms are of high severity. These symptoms are constant and intermittent/ fleeting in nature.  The patient's condition has been precipitated by multiple psychosocial stressors .  Patient's condition made worse by continued illicit drug use as well as treatment noncompliance. UDS: +MJ, Amphetamines; BAL=0.   Pt is a [place] and just returned from [place]. He presents very distractible. He has been on various anti-psychotics in the past and refuses to start one on. Reluctantly agreed to a trial of Seroquel.  7/7-Appears less anxious today but is very doubtful about taking Psych meds. Still has delusional thoughts. Slept 4 hrs. Prn used Atarax. Mom visited yesterday.   ALLERGIES:   Allergies   Allergen Reactions   ??? Aspirin Swelling      MEDICATIONS PRIOR TO ADMISSION:   No medications prior to admission.      PAST MEDICAL HISTORY:   No past medical history on file.No past surgical history on file.   SOCIAL HISTORY: Per SW note   Social History      Socioeconomic History   ??? Marital status: SINGLE     Spouse name: Not on file   ??? Number of children: Not on file   ??? Years of education: Not on file   ??? Highest education level: Not on file   Occupational History   ??? Not on file   Social Needs   ??? Financial resource strain: Not on file   ??? Food insecurity:     Worry: Not on file     Inability: Not on file   ??? Transportation needs:     Medical: Not on file     Non-medical: Not on file   Tobacco Use   ??? Smoking status: Not on file   Substance and Sexual Activity   ??? Alcohol use: Not on file   ??? Drug use: Not on file   ??? Sexual activity: Not on file   Lifestyle   ??? Physical activity:     Days per week: Not on file     Minutes per session: Not on file   ??? Stress: Not on file   Relationships   ??? Social connections:     Talks on phone: Not on file     Gets together: Not on file     Attends religious service: Not  on file     Active member of club or organization: Not on file     Attends meetings of clubs or organizations: Not on file     Relationship status: Not on file   ??? Intimate partner violence:     Fear of current or ex partner: Not on file     Emotionally abused: Not on file     Physically abused: Not on file     Forced sexual activity: Not on file   Other Topics Concern   ??? Not on file   Social History Narrative   ??? Not on file      FAMILY HISTORY: History reviewed. No pertinent family history.   No family history on file.    REVIEW OF SYSTEMS:   Psychological ROS: positive for - anxiety, concentration difficulties and sleep disturbances  negative for - disorientation, hallucinations or hostility  Pertinent items are noted in the History of Present Illness.  All other Systems reviewed and are considered negative.           MENTAL STATUS EXAM & VITALS     MENTAL STATUS EXAM (MSE):    MSE FINDINGS ARE WITHIN NORMAL LIMITS (WNL) UNLESS OTHERWISE STATED BELOW. ( ALL OF THE BELOW CATEGORIES OF THE MSE HAVE BEEN REVIEWED (reviewed 06/27/2018) AND UPDATED AS DEEMED  APPROPRIATE )  General Presentation age appropriate and casually dressed, cooperative   Orientation oriented to time, place and person   Vital Signs  See below (reviewed 06/27/2018); Vital Signs (BP, Pulse, & Temp) are within normal limits if not listed below.   Gait and Station Stable/steady, no ataxia   Musculoskeletal System No extrapyramidal symptoms (EPS); no abnormal muscular movements or Tardive Dyskinesia (TD); muscle strength and tone are within normal limits   Language No aphasia or dysarthria   Speech:  hypoverbal   Thought Processes Illogical; normal rate of thoughts; poor abstract reasoning/computation   Thought Associations circumstantial   Thought Content paranoid delusions and preoccupations   Suicidal Ideations none   Homicidal Ideations none   Mood:  anxious    Affect:  sad   Memory recent  good   Memory remote:  good   Concentration/Attention:  distractable   Fund of Knowledge average   Insight:  limited   Reliability poor   Judgment:  limited          VITALS:     Patient Vitals for the past 24 hrs:   Temp Pulse Resp BP SpO2   06/27/18 1015 99 ??F (37.2 ??C) 83 18 114/67 99 %   06/26/18 1930 98.1 ??F (36.7 ??C) 81 18 133/80 99 %     Wt Readings from Last 3 Encounters:   06/26/18 63.5 kg (140 lb)     Temp Readings from Last 3 Encounters:   06/27/18 99 ??F (37.2 ??C)     BP Readings from Last 3 Encounters:   06/27/18 114/67     Pulse Readings from Last 3 Encounters:   06/27/18 83            DATA     LABORATORY DATA:  Labs Reviewed   TSH 3RD GENERATION - Abnormal; Notable for the following components:       Result Value    TSH 6.43 (*)     All other components within normal limits   LIPID PANEL - Abnormal; Notable for the following components:    Cholesterol, total 264 (*)     LDL, calculated 183.2 (*)  All other components within normal limits   GLUCOSE, FASTING     Admission on 06/26/2018   Component Date Value Ref Range Status   ??? Glucose 06/27/2018 98  65 - 100 MG/DL Final   ??? TSH 16/09/9603 6.43*  0.36 - 3.74 uIU/mL Final   ??? LIPID PROFILE 06/27/2018        Final   ??? Cholesterol, total 06/27/2018 264* <200 MG/DL Final   ??? Triglyceride 06/27/2018 104  <150 MG/DL Final   ??? HDL Cholesterol 06/27/2018 60  MG/DL Final   ??? LDL, calculated 06/27/2018 183.2* 0 - 100 MG/DL Final   ??? VLDL, calculated 06/27/2018 20.8  MG/DL Final   ??? CHOL/HDL Ratio 06/27/2018 4.4  0.0 - 5.0   Final        RADIOLOGY REPORTS:  No results found for this or any previous visit.No results found.           MEDICATIONS       ALL MEDICATIONS  Current Facility-Administered Medications   Medication Dose Route Frequency   ??? ziprasidone (GEODON) 20 mg in sterile water (preservative free) 1 mL injection  20 mg IntraMUSCular BID PRN   ??? OLANZapine (ZyPREXA) tablet 5 mg  5 mg Oral Q6H PRN   ??? benztropine (COGENTIN) tablet 2 mg  2 mg Oral BID PRN   ??? benztropine (COGENTIN) injection 2 mg  2 mg IntraMUSCular BID PRN   ??? LORazepam (ATIVAN) injection 2 mg  2 mg IntraMUSCular Q4H PRN   ??? acetaminophen (TYLENOL) tablet 650 mg  650 mg Oral Q4H PRN   ??? magnesium hydroxide (MILK OF MAGNESIA) 400 mg/5 mL oral suspension 30 mL  30 mL Oral DAILY PRN   ??? nicotine (NICODERM CQ) 21 mg/24 hr patch 1 Patch  1 Patch TransDERmal DAILY PRN   ??? hydrOXYzine HCl (ATARAX) tablet 50 mg  50 mg Oral Q6H PRN   ??? traZODone (DESYREL) tablet 50 mg  50 mg Oral QHS PRN   ??? QUEtiapine (SEROquel) tablet 50 mg  50 mg Oral QHS      SCHEDULED MEDICATIONS  Current Facility-Administered Medications   Medication Dose Route Frequency   ??? QUEtiapine (SEROquel) tablet 50 mg  50 mg Oral QHS                ASSESSMENT & PLAN        The patient, Kenneth Hill, is a 22 y.o.  male who presents at this time for treatment of the following diagnoses:  Patient Active Hospital Problem List:   Schizoaffective disorder (HCC) (06/26/2018)    Assessment: Presents distractible, denies current AVH, paranoid delusions reported at the time of ER eval.    Plan: Agreed to a trial of Seroquel 50 mg qhs          I will  continue to monitor blood levels (Depakote, Tegretol, lithium, clozapine---a drug with a narrow therapeutic index= NTI) and associated labs for drug therapy implemented that require intense monitoring for toxicity as deemed appropriate based on current medication side effects and pharmacodynamically determined drug 1/2 lives.         A coordinated, multidisplinary treatment team (includes the nurse, unit pharmcist, Administrator) round was conducted for this initial evaluation with the patient present.     The following regarding medications was addressed during rounds with patient:   the risks and benefits of the proposed medication. The patient was given the opportunity to ask questions. Informed consent given to the use of  the above medications.     I will continue to adjust psychiatric and non-psychiatric medications (see above "medication" section and orders section for details) as deemed appropriate & based upon diagnoses and response to treatment.     I have reviewed admission (and previous/old) labs and medical tests in the EHR and or transferring hospital documents. I will continue to order blood tests/labs and diagnostic tests as deemed appropriate and review results as they become available (see orders for details).    I have reviewed old psychiatric and medical records available in the EHR. I Will order additional psychiatric records from other institutions to further elucidate the nature of patient's psychopathology and review once available.    I will gather additional collateral information from friends, family and o/p treatment team to further elucidate the nature of patient's psychopathology and baselline level of psychiatric functioning.      ESTIMATED LENGTH OF STAY:    4-5 days       STRENGTHS:  Access to housing/residential stability, Interpersonal/supportive relationships (family, friends, peers) and Awareness of Substance abuse issues                                        SIGNED:     Elby Showers, MD  06/27/2018

## 2018-06-28 MED ORDER — QUETIAPINE 100 MG TAB
100 mg | Freq: Every evening | ORAL | Status: DC
Start: 2018-06-28 — End: 2018-06-29
  Administered 2018-06-29: 01:00:00 via ORAL

## 2018-06-28 MED FILL — QUETIAPINE 25 MG TAB: 25 mg | ORAL | Qty: 2

## 2018-06-28 NOTE — Behavioral Health Treatment Team (Signed)
PSYCHOSOCIAL ASSESSMENT  :Patient identifying info:  Kenneth Hill is a 22 y.o., male admitted 06/26/2018  6:37 AM     Presenting problem and precipitating factors: Pt was admitted under a TDO status due to psychosis - was paranoid and grabbed a weapon to protect himself from visual hallucinations. Pt was visibly under influence of drugs but unable to communicate what was used.  Pt reported he "dabbles" in a lot of drugs.  Pt shared with the treatment team that he smokes MJ regularly and smoked "diamond" about a week ago that he thought was MJ.  Pt was positive for amphetamines and diamond is  street slang for meth.       Mental status assessment:Social Work - Pt was seen in treatment team this morning. Pt is alert and oriented.  Pt denies SI/HI.  Pt's mood is euthymic, affect is mood congruent.  Pt's thought process is logical.  Pt's insight and judgment is poor, reliability is fair.  Social work department will continue to coordinate discharge plans.     Strengths: Verbalizes concerns and has family support.     Collateral information:  Awaiting for Hieu Kerman - pt's brother to return call.  ROI signed. [phone] prescreening family reports pt exhibiting delusional behaviors for some time. Pt went to [place] in Spring 2019 for treatment and was placed on Zyprexa in April 2019 but was sleeping excessively and took him off the medication.   Family reports that pt could not     Current psychiatric /substance abuse providers and contact info: None.    Previous psychiatric/substance abuse providers and response to treatment: None.     Family history of mental illness or substance abuse:     Substance abuse history:  UDS + MJ, Amphetamines   BAL=0  Per reports he has a history of DUIs and does not have a license as a result.   Social History     Tobacco Use   ??? Smoking status: Not on file   Substance Use Topics   ??? Alcohol use: Not on file       History of biomedical complications associated with substance abuse:  Unknown    Patient's current acceptance of treatment or motivation for change: Signed treatment plan.     Family constellation: Pt is single and does not have children    Is significant other involved? N/A.    Describe support system: Supportive parents and brother.     Describe living arrangements and home environment:  Pt lives with his mother, stepfather and brother.     Health issues:   Hospital Problems  Never Reviewed          Codes Class Noted POA    * (Principal) Schizoaffective disorder (HCC) ICD-10-CM: F25.9  ICD-9-CM: 295.70  06/26/2018 Unknown              Trauma history:  None reported     Legal issues:  Multiple drinking under influence charges.     History of military service: None    Financial status: Family    Religious/cultural factors: None expressed at this time.    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     Have you been licensed as a health care professional (current or expired): No    Leisure and recreation preferences: video games and exercising.     Describe coping skills: Ineffectual       Alison Flinn  06/28/2018

## 2018-06-28 NOTE — Behavioral Health Treatment Team (Signed)
PSYCHIATRIC PROGRESS NOTE          IDENTIFICATION:    Patient Name  Kenneth Hill   Date of Birth [birth year]   CSN [id number]   Medical Record Number  [id number]      Age  22 y.o.   PCP None   Admit date:  06/26/2018    Room Number  312/01  @ [facility]   Date of Service  06/28/2018            HISTORY         REASON FOR HOSPITALIZATION:  CC:  Pt admitted under a temporary detention order (TDO) with severe psychosis and proving to be an imminent danger to self and others.    HISTORY OF PRESENT ILLNESS:    The patient, Kenneth Hill, is a 22 y.o.  [place] male with a past psychiatric history significant for Psychosis, who presents at this time with complaints of (and/or evidence of) the following emotional symptoms: agitation, delusions, paranoid behavior, anxiety and psychosis.  Additional symptomatology include anxiety, fearfulness, poor concentration and problem with medication.  The above symptoms have been present for a week. These symptoms are of high severity. These symptoms are constant and intermittent/ fleeting in nature.  The patient's condition has been precipitated by multiple psychosocial stressors .  Patient's condition made worse by continued illicit drug use as well as treatment noncompliance. UDS: +MJ, Amphetamines; BAL=0.   Pt is a [place] and just returned from [place]. He presents very distractible. He has been on various anti-psychotics in the past and refuses to start one on. Reluctantly agreed to a trial of Seroquel.  7/7-Appears less anxious today but is very doubtful about taking Psych meds. Still has delusional thoughts. Slept 4 hrs. Prn used Atarax. Mom visited yesterday.  7/8- patient has been visible, continues to be grossly disorganized. Patient with ongoing delusions per nursing; tolerating medication thus far. Denies SI/HI/AVH on interview. Endorses MJ use.     ALLERGIES:   Allergies   Allergen Reactions   ??? Aspirin Swelling       MEDICATIONS PRIOR TO ADMISSION:   No medications prior to admission.      PAST MEDICAL HISTORY:   No past medical history on file.No past surgical history on file.   SOCIAL HISTORY: Per SW note   Social History     Socioeconomic History   ??? Marital status: SINGLE     Spouse name: Not on file   ??? Number of children: Not on file   ??? Years of education: Not on file   ??? Highest education level: Not on file   Occupational History   ??? Not on file   Social Needs   ??? Financial resource strain: Not on file   ??? Food insecurity:     Worry: Not on file     Inability: Not on file   ??? Transportation needs:     Medical: Not on file     Non-medical: Not on file   Tobacco Use   ??? Smoking status: Not on file   Substance and Sexual Activity   ??? Alcohol use: Not on file   ??? Drug use: Not on file   ??? Sexual activity: Not on file   Lifestyle   ??? Physical activity:     Days per week: Not on file     Minutes per session: Not on file   ??? Stress: Not on file   Relationships   ??? Social  connections:     Talks on phone: Not on file     Gets together: Not on file     Attends religious service: Not on file     Active member of club or organization: Not on file     Attends meetings of clubs or organizations: Not on file     Relationship status: Not on file   ??? Intimate partner violence:     Fear of current or ex partner: Not on file     Emotionally abused: Not on file     Physically abused: Not on file     Forced sexual activity: Not on file   Other Topics Concern   ??? Not on file   Social History Narrative   ??? Not on file      FAMILY HISTORY: History reviewed. No pertinent family history.   No family history on file.    REVIEW OF SYSTEMS:   Psychological ROS: positive for - anxiety, concentration difficulties and sleep disturbances  negative for - disorientation, hallucinations or hostility  Pertinent items are noted in the History of Present Illness.  All other Systems reviewed and are considered negative.           MENTAL STATUS EXAM & VITALS      MENTAL STATUS EXAM (MSE):    MSE FINDINGS ARE WITHIN NORMAL LIMITS (WNL) UNLESS OTHERWISE STATED BELOW. ( ALL OF THE BELOW CATEGORIES OF THE MSE HAVE BEEN REVIEWED (reviewed 06/28/2018) AND UPDATED AS DEEMED APPROPRIATE )  General Presentation age appropriate and casually dressed, cooperative   Orientation oriented to time, place and person   Vital Signs  See below (reviewed 06/28/2018); Vital Signs (BP, Pulse, & Temp) are within normal limits if not listed below.   Gait and Station Stable/steady, no ataxia   Musculoskeletal System No extrapyramidal symptoms (EPS); no abnormal muscular movements or Tardive Dyskinesia (TD); muscle strength and tone are within normal limits   Language No aphasia or dysarthria   Speech:  hypoverbal   Thought Processes Illogical; normal rate of thoughts; poor abstract reasoning/computation   Thought Associations circumstantial   Thought Content paranoid delusions and preoccupations   Suicidal Ideations none   Homicidal Ideations none   Mood:  anxious    Affect:  sad   Memory recent  good   Memory remote:  good   Concentration/Attention:  distractable   Fund of Knowledge average   Insight:  limited   Reliability poor   Judgment:  limited          VITALS:     Patient Vitals for the past 24 hrs:   Temp Pulse Resp BP SpO2   06/28/18 2035 98 ??F (36.7 ??C) 60 16 110/72 99 %   06/28/18 0816 97.9 ??F (36.6 ??C) 79 16 97/80 100 %     Wt Readings from Last 3 Encounters:   06/26/18 63.5 kg (140 lb)     Temp Readings from Last 3 Encounters:   06/28/18 98 ??F (36.7 ??C)     BP Readings from Last 3 Encounters:   06/28/18 110/72     Pulse Readings from Last 3 Encounters:   06/28/18 60            DATA     LABORATORY DATA:  Labs Reviewed   TSH 3RD GENERATION - Abnormal; Notable for the following components:       Result Value    TSH 6.43 (*)     All other components within normal limits  LIPID PANEL - Abnormal; Notable for the following components:    Cholesterol, total 264 (*)      LDL, calculated 183.2 (*)     All other components within normal limits   GLUCOSE, FASTING     Admission on 06/26/2018   Component Date Value Ref Range Status   ??? Glucose 06/27/2018 98  65 - 100 MG/DL Final   ??? TSH 16/09/9603 6.43* 0.36 - 3.74 uIU/mL Final   ??? LIPID PROFILE 06/27/2018        Final   ??? Cholesterol, total 06/27/2018 264* <200 MG/DL Final   ??? Triglyceride 06/27/2018 104  <150 MG/DL Final   ??? HDL Cholesterol 06/27/2018 60  MG/DL Final   ??? LDL, calculated 06/27/2018 183.2* 0 - 100 MG/DL Final   ??? VLDL, calculated 06/27/2018 20.8  MG/DL Final   ??? CHOL/HDL Ratio 06/27/2018 4.4  0.0 - 5.0   Final        RADIOLOGY REPORTS:  No results found for this or any previous visit.No results found.           MEDICATIONS       ALL MEDICATIONS  Current Facility-Administered Medications   Medication Dose Route Frequency   ??? QUEtiapine (SEROquel) tablet 100 mg  100 mg Oral QHS   ??? ziprasidone (GEODON) 20 mg in sterile water (preservative free) 1 mL injection  20 mg IntraMUSCular BID PRN   ??? OLANZapine (ZyPREXA) tablet 5 mg  5 mg Oral Q6H PRN   ??? benztropine (COGENTIN) tablet 2 mg  2 mg Oral BID PRN   ??? benztropine (COGENTIN) injection 2 mg  2 mg IntraMUSCular BID PRN   ??? LORazepam (ATIVAN) injection 2 mg  2 mg IntraMUSCular Q4H PRN   ??? acetaminophen (TYLENOL) tablet 650 mg  650 mg Oral Q4H PRN   ??? magnesium hydroxide (MILK OF MAGNESIA) 400 mg/5 mL oral suspension 30 mL  30 mL Oral DAILY PRN   ??? nicotine (NICODERM CQ) 21 mg/24 hr patch 1 Patch  1 Patch TransDERmal DAILY PRN   ??? hydrOXYzine HCl (ATARAX) tablet 50 mg  50 mg Oral Q6H PRN   ??? traZODone (DESYREL) tablet 50 mg  50 mg Oral QHS PRN      SCHEDULED MEDICATIONS  Current Facility-Administered Medications   Medication Dose Route Frequency   ??? QUEtiapine (SEROquel) tablet 100 mg  100 mg Oral QHS              ASSESSMENT & PLAN        The patient, Kenneth Hill, is a 21 y.o.  male who presents at this time for treatment of the following diagnoses:   Patient Active Hospital Problem List:   Schizoaffective disorder (HCC) (06/26/2018)    Assessment: Presents distractible, denies current AVH, paranoid delusions reported at the time of ER eval.    Plan: INCREASE Seroquel to 100 mg QHS for psychosis         A coordinated, multidisplinary treatment team (includes the nurse, unit pharmcist, Administrator) round was conducted for this initial evaluation with the patient present.     The following regarding medications was addressed during rounds with patient:   the risks and benefits of the proposed medication. The patient was given the opportunity to ask questions. Informed consent given to the use of the above medications.     I will continue to adjust psychiatric and non-psychiatric medications (see above "medication" section and orders section for details) as deemed appropriate & based  upon diagnoses and response to treatment.     I have reviewed admission (and previous/old) labs and medical tests in the EHR and or transferring hospital documents. I will continue to order blood tests/labs and diagnostic tests as deemed appropriate and review results as they become available (see orders for details).    I have reviewed old psychiatric and medical records available in the EHR. I Will order additional psychiatric records from other institutions to further elucidate the nature of patient's psychopathology and review once available.    I will gather additional collateral information from friends, family and o/p treatment team to further elucidate the nature of patient's psychopathology and baselline level of psychiatric functioning.      ESTIMATED LENGTH OF STAY:    4-5 days       STRENGTHS:  Access to housing/residential stability, Interpersonal/supportive relationships (family, friends, peers) and Awareness of Substance abuse issues                                        SIGNED:    Hiram Gashsitadinma L Travarus Trudo, MD  06/28/2018

## 2018-06-28 NOTE — Behavioral Health Treatment Team (Cosign Needed)
Patient was seen by the [facility] for a [place] TDO and was committed.

## 2018-06-28 NOTE — Behavioral Health Treatment Team (Cosign Needed)
GROUP THERAPY PROGRESS NOTE    The patient Kenneth Hill a 22 y.o. male is participating in Coping Skills Group.     Group time: 45 minutes    Personal goal for participation: To participate in chair exercise routine    Goal orientation:  personal    Group therapy participation: active    Therapeutic interventions reviewed and discussed: benefits of exercise    Impression of participation:  The patient was attentive.    [name]  06/28/2018  5:31 PM

## 2018-06-28 NOTE — Behavioral Health Treatment Team (Signed)
Patient slept 5.5 hours. Tasha Washington BSN, RN

## 2018-06-28 NOTE — Behavioral Health Treatment Team (Cosign Needed)
GROUP THERAPY PROGRESS NOTE    The patient Kenneth Hill a 22 y.o. male is participating in Creative Expression Group.     Group time: 1 hour    Personal goal for participation: To concentrate on selected task    Goal orientation: social    Group therapy participation: active    Therapeutic interventions reviewed and discussed: Crafts, games, music    Impression of participation: The patient was attentive.    [name]  06/28/2018  6:02 PM

## 2018-06-28 NOTE — Progress Notes (Addendum)
0708 Kenneth Hill received report from Tasha Washington RN.    0816 Pt resting in bed. Pt presents with a cooperative attitude, flat affect, and anxious mood. Pt denied SI, HI, and AVH. Pt is preoccupied and paranoid.     0920 Pt in dayroom. Pt keeping to self.     1215 Pt ate 75% of his lunch. Pt calm and cooperative keeping to self.

## 2018-06-28 NOTE — Behavioral Health Treatment Team (Deleted)
GROUP THERAPY PROGRESS NOTE    The patient Kenneth Hill a 22 y.o. male is participating in Creative Expression Group.     Group time: 1 hour    Personal goal for participation: To concentrate on selected task    Goal orientation: social    Group therapy participation: active    Therapeutic interventions reviewed and discussed: Crafts, games, music    Impression of participation: The patient was attentive.    Adria DevonBeverly S Baker  06/28/2018  6:02 PM

## 2018-06-28 NOTE — Progress Notes (Signed)
[id number] Kenneth Hill [facility] report from [facility].    [phone] Pt resting in bed. Pt presents with a cooperative attitude, flat affect, and anxious mood. Pt denied SI, HI, and AVH. Pt is preoccupied and paranoid.     0920 Pt in dayroom. Pt keeping to self.     1215 Pt ate 75% of his lunch. Pt calm and cooperative keeping to self.

## 2018-06-28 NOTE — Behavioral Health Treatment Team (Deleted)
GROUP THERAPY PROGRESS NOTE    The patient Kenneth Hill a 22 y.o. male is participating in Coping Skills Group.     Group time: 45 minutes    Personal goal for participation: To participate in chair exercise routine    Goal orientation:  personal    Group therapy participation: active    Therapeutic interventions reviewed and discussed: benefits of exercise    Impression of participation:  The patient was attentive.    Audelia HivesBeverly S Baker  06/28/2018  5:31 PM

## 2018-06-28 NOTE — Behavioral Health Treatment Team (Deleted)
Patient was seen by the [facility] for a [place] TDO and was committed.

## 2018-06-28 NOTE — Behavioral Health Treatment Team (Signed)
PSYCHIATRIC PROGRESS NOTE            IDENTIFICATION:    Patient Name  Kenneth Hill   Date of Birth [birth year]   CSN [id number]   Medical Record Number  [id number]      Age  22 y.o.   PCP None   Admit date:  06/26/2018    Room Number  312/01  @ [facility]   Date of Service  06/28/2018            HISTORY         REASON FOR HOSPITALIZATION:  CC:  Pt admitted under a temporary detention order (TDO) with severe psychosis and proving to be an imminent danger to self and others.    HISTORY OF PRESENT ILLNESS:    The patient, Kenneth Hill, is a 22 y.o.  [place] male with a past psychiatric history significant for Psychosis, who presents at this time with complaints of (and/or evidence of) the following emotional symptoms: agitation, delusions, paranoid behavior, anxiety and psychosis.  Additional symptomatology include anxiety, fearfulness, poor concentration and problem with medication.  The above symptoms have been present for a week. These symptoms are of high severity. These symptoms are constant and intermittent/ fleeting in nature.  The patient's condition has been precipitated by multiple psychosocial stressors .  Patient's condition made worse by continued illicit drug use as well as treatment noncompliance. UDS: +MJ, Amphetamines; BAL=0.   Pt is a [place] and just returned from [place]. He presents very distractible. He has been on various anti-psychotics in the past and refuses to start one on. Reluctantly agreed to a trial of Seroquel.  7/7-Appears less anxious today but is very doubtful about taking Psych meds. Still has delusional thoughts. Slept 4 hrs. Prn used Atarax. Mom visited yesterday.  7/8- patient has been visible, continues to be grossly disorganized. Patient with ongoing delusions per nursing; tolerating medication thus far. Denies SI/HI/AVH on interview. Endorses MJ use.     ALLERGIES:   Allergies   Allergen Reactions   ??? Aspirin Swelling      MEDICATIONS PRIOR TO  ADMISSION:   No medications prior to admission.      PAST MEDICAL HISTORY:   No past medical history on file.No past surgical history on file.   SOCIAL HISTORY: Per SW note   Social History     Socioeconomic History   ??? Marital status: SINGLE     Spouse name: Not on file   ??? Number of children: Not on file   ??? Years of education: Not on file   ??? Highest education level: Not on file   Occupational History   ??? Not on file   Social Needs   ??? Financial resource strain: Not on file   ??? Food insecurity:     Worry: Not on file     Inability: Not on file   ??? Transportation needs:     Medical: Not on file     Non-medical: Not on file   Tobacco Use   ??? Smoking status: Not on file   Substance and Sexual Activity   ??? Alcohol use: Not on file   ??? Drug use: Not on file   ??? Sexual activity: Not on file   Lifestyle   ??? Physical activity:     Days per week: Not on file     Minutes per session: Not on file   ??? Stress: Not on file   Relationships   ???  Social connections:     Talks on phone: Not on file     Gets together: Not on file     Attends religious service: Not on file     Active member of club or organization: Not on file     Attends meetings of clubs or organizations: Not on file     Relationship status: Not on file   ??? Intimate partner violence:     Fear of current or ex partner: Not on file     Emotionally abused: Not on file     Physically abused: Not on file     Forced sexual activity: Not on file   Other Topics Concern   ??? Not on file   Social History Narrative   ??? Not on file      FAMILY HISTORY: History reviewed. No pertinent family history.   No family history on file.    REVIEW OF SYSTEMS:   Psychological ROS: positive for - anxiety, concentration difficulties and sleep disturbances  negative for - disorientation, hallucinations or hostility  Pertinent items are noted in the History of Present Illness.  All other Systems reviewed and are considered negative.           MENTAL STATUS EXAM & VITALS     MENTAL STATUS EXAM  (MSE):    MSE FINDINGS ARE WITHIN NORMAL LIMITS (WNL) UNLESS OTHERWISE STATED BELOW. ( ALL OF THE BELOW CATEGORIES OF THE MSE HAVE BEEN REVIEWED (reviewed 06/28/2018) AND UPDATED AS DEEMED APPROPRIATE )  General Presentation age appropriate and casually dressed, cooperative   Orientation oriented to time, place and person   Vital Signs  See below (reviewed 06/28/2018); Vital Signs (BP, Pulse, & Temp) are within normal limits if not listed below.   Gait and Station Stable/steady, no ataxia   Musculoskeletal System No extrapyramidal symptoms (EPS); no abnormal muscular movements or Tardive Dyskinesia (TD); muscle strength and tone are within normal limits   Language No aphasia or dysarthria   Speech:  hypoverbal   Thought Processes Illogical; normal rate of thoughts; poor abstract reasoning/computation   Thought Associations circumstantial   Thought Content paranoid delusions and preoccupations   Suicidal Ideations none   Homicidal Ideations none   Mood:  anxious    Affect:  sad   Memory recent  good   Memory remote:  good   Concentration/Attention:  distractable   Fund of Knowledge average   Insight:  limited   Reliability poor   Judgment:  limited          VITALS:     Patient Vitals for the past 24 hrs:   Temp Pulse Resp BP SpO2   06/28/18 2035 98 ??F (36.7 ??C) 60 16 110/72 99 %   06/28/18 0816 97.9 ??F (36.6 ??C) 79 16 97/80 100 %     Wt Readings from Last 3 Encounters:   06/26/18 63.5 kg (140 lb)     Temp Readings from Last 3 Encounters:   06/28/18 98 ??F (36.7 ??C)     BP Readings from Last 3 Encounters:   06/28/18 110/72     Pulse Readings from Last 3 Encounters:   06/28/18 60            DATA     LABORATORY DATA:  Labs Reviewed   TSH 3RD GENERATION - Abnormal; Notable for the following components:       Result Value    TSH 6.43 (*)     All other components within normal  limits   LIPID PANEL - Abnormal; Notable for the following components:    Cholesterol, total 264 (*)     LDL, calculated 183.2 (*)     All other  components within normal limits   GLUCOSE, FASTING     Admission on 06/26/2018   Component Date Value Ref Range Status   ??? Glucose 06/27/2018 98  65 - 100 MG/DL Final   ??? TSH 16/10/960407/06/2018 6.43* 0.36 - 3.74 uIU/mL Final   ??? LIPID PROFILE 06/27/2018        Final   ??? Cholesterol, total 06/27/2018 264* <200 MG/DL Final   ??? Triglyceride 06/27/2018 104  <150 MG/DL Final   ??? HDL Cholesterol 06/27/2018 60  MG/DL Final   ??? LDL, calculated 06/27/2018 183.2* 0 - 100 MG/DL Final   ??? VLDL, calculated 06/27/2018 20.8  MG/DL Final   ??? CHOL/HDL Ratio 06/27/2018 4.4  0.0 - 5.0   Final        RADIOLOGY REPORTS:  No results found for this or any previous visit.No results found.           MEDICATIONS       ALL MEDICATIONS  Current Facility-Administered Medications   Medication Dose Route Frequency   ??? QUEtiapine (SEROquel) tablet 100 mg  100 mg Oral QHS   ??? ziprasidone (GEODON) 20 mg in sterile water (preservative free) 1 mL injection  20 mg IntraMUSCular BID PRN   ??? OLANZapine (ZyPREXA) tablet 5 mg  5 mg Oral Q6H PRN   ??? benztropine (COGENTIN) tablet 2 mg  2 mg Oral BID PRN   ??? benztropine (COGENTIN) injection 2 mg  2 mg IntraMUSCular BID PRN   ??? LORazepam (ATIVAN) injection 2 mg  2 mg IntraMUSCular Q4H PRN   ??? acetaminophen (TYLENOL) tablet 650 mg  650 mg Oral Q4H PRN   ??? magnesium hydroxide (MILK OF MAGNESIA) 400 mg/5 mL oral suspension 30 mL  30 mL Oral DAILY PRN   ??? nicotine (NICODERM CQ) 21 mg/24 hr patch 1 Patch  1 Patch TransDERmal DAILY PRN   ??? hydrOXYzine HCl (ATARAX) tablet 50 mg  50 mg Oral Q6H PRN   ??? traZODone (DESYREL) tablet 50 mg  50 mg Oral QHS PRN      SCHEDULED MEDICATIONS  Current Facility-Administered Medications   Medication Dose Route Frequency   ??? QUEtiapine (SEROquel) tablet 100 mg  100 mg Oral QHS                ASSESSMENT & PLAN        The patient, Kenneth Hill, is a 22 y.o.  male who presents at this time for treatment of the following diagnoses:  Patient Active Hospital Problem List:   Schizoaffective  disorder (HCC) (06/26/2018)    Assessment: Presents distractible, denies current AVH, paranoid delusions reported at the time of ER eval.    Plan: INCREASE Seroquel to 100 mg QHS for psychosis         A coordinated, multidisplinary treatment team (includes the nurse, unit pharmcist, Administratorsocial worker and writer) round was conducted for this initial evaluation with the patient present.     The following regarding medications was addressed during rounds with patient:   the risks and benefits of the proposed medication. The patient was given the opportunity to ask questions. Informed consent given to the use of the above medications.     I will continue to adjust psychiatric and non-psychiatric medications (see above "medication" section and orders section for details)  as deemed appropriate & based upon diagnoses and response to treatment.     I have reviewed admission (and previous/old) labs and medical tests in the EHR and or transferring hospital documents. I will continue to order blood tests/labs and diagnostic tests as deemed appropriate and review results as they become available (see orders for details).    I have reviewed old psychiatric and medical records available in the EHR. I Will order additional psychiatric records from other institutions to further elucidate the nature of patient's psychopathology and review once available.    I will gather additional collateral information from friends, family and o/p treatment team to further elucidate the nature of patient's psychopathology and baselline level of psychiatric functioning.      ESTIMATED LENGTH OF STAY:    4-5 days       STRENGTHS:  Access to housing/residential stability, Interpersonal/supportive relationships (family, friends, peers) and Awareness of Substance abuse issues                                        SIGNED:    Hiram Gash, MD  06/28/2018

## 2018-06-28 NOTE — Behavioral Health Treatment Team (Signed)
Patient slept 5.5 hours. Worthy Flankasha Washington BSN, RN

## 2018-06-28 NOTE — Behavioral Health Treatment Team (Signed)
PSYCHOSOCIAL ASSESSMENT  :Patient identifying info:  Kenneth Hill is a 22 y.o., male admitted 06/26/2018  6:37 AM     Presenting problem and precipitating factors: Pt was admitted under a TDO status due to psychosis - was paranoid and grabbed a weapon to protect himself from visual hallucinations. Pt was visibly under influence of drugs but unable to communicate what was used.  Pt reported he "dabbles" in a lot of drugs.  Pt shared with the treatment team that he smokes MJ regularly and smoked "diamond" about a week ago that he thought was MJ.  Pt was positive for amphetamines and diamond is  street slang for meth.       Mental status assessment:Social Work - Pt was seen in treatment team this morning. Pt is alert and oriented.  Pt denies SI/HI.  Pt's mood is euthymic, affect is mood congruent.  Pt's thought process is logical.  Pt's insight and judgment is poor, reliability is fair.  Social work department will continue to coordinate discharge plans.     Strengths: Verbalizes concerns and has family support.     Collateral information:  Awaiting for [name] - pt's brother to return call.  ROI signed. [phone] prescreening family reports pt exhibiting delusional behaviors for some time. Pt went to [place] in Spring 2019 for treatment and was placed on Zyprexa in April 2019 but was sleeping excessively and took him off the medication.   Family reports that pt could not     Current psychiatric /substance abuse providers and contact info: None.    Previous psychiatric/substance abuse providers and response to treatment: None.     Family history of mental illness or substance abuse:     Substance abuse history:  UDS + MJ, Amphetamines   BAL=0  Per reports he has a history of DUIs and does not have a license as a result.   Social History     Tobacco Use   . Smoking status: Not on file   Substance Use Topics   . Alcohol use: Not on file       History of biomedical complications associated with substance abuse:  Unknown    Patient's current acceptance of treatment or motivation for change: Signed treatment plan.     Family constellation: Pt is single and does not have children    Is significant other involved? N/A.    Describe support system: Supportive parents and brother.     Describe living arrangements and home environment:  Pt lives with his mother, stepfather and brother.     Health issues:   Hospital Problems  Never Reviewed          Codes Class Noted POA    * (Principal) Schizoaffective disorder (HCC) ICD-10-CM: F25.9  ICD-9-CM: 295.70  06/26/2018 Unknown              Trauma history:  None reported     Legal issues:  Multiple drinking under influence charges.     History of military service: None    Financial status: Family    Religious/cultural factors: None expressed at this time.    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     Have you been licensed as a health care professional (current or expired): No    Leisure and recreation preferences: video games and exercising.     Describe coping skills: Ineffectual  Jacques Navy  06/28/2018

## 2018-06-29 MED ORDER — QUETIAPINE SR 300 MG 24 HR TAB
300 mg | Freq: Every evening | ORAL | Status: DC
Start: 2018-06-29 — End: 2018-07-05
  Administered 2018-06-30 – 2018-07-05 (×8): via ORAL

## 2018-06-29 MED FILL — QUETIAPINE 100 MG TAB: 100 mg | ORAL | Qty: 1

## 2018-06-29 NOTE — Progress Notes (Signed)
Laboratory monitoring for mood stabilizer and antipsychotics:    Recommended baseline monitoring has been completed based on this patient's current medication regimen. Please note that patient's TSH is elevated and patient's LDL & total cholesterol are elevated.     The following labs were completed at transferring facility: WBC 15.04 (elevated), Hgb 17.2, Hct 49.8, Plts 247, sodium 136, potassium 3.7, BUN 9, creatinine 0.93, glucose 118, calcium 9.7, AST 36, ALT 38, alk phos 92, total bili 1.1, albumin 5.2 (elevated), UDS (+) amphetamines & THC   The patient is currently taking the following medication(s):   Current Facility-Administered Medications   Medication Dose Route Frequency    QUEtiapine (SEROquel) tablet 100 mg  100 mg Oral QHS       Height, Weight, BMI Estimation  Estimated body mass index is 22.6 kg/m?? as calculated from the following:    Height as of this encounter: 167.6 cm (66").    Weight as of this encounter: 63.5 kg (140 lb).     Renal Function, Hepatic Function and Chemistry  See above    Lab Results   Component Value Date/Time    Glucose 98 06/27/2018 05:06 AM       Lab Results   Component Value Date/Time    Glucose 98 06/27/2018 05:06 AM       Hematology  See above    Lipids  Lab Results   Component Value Date/Time    Cholesterol, total 264 (H) 06/27/2018 05:06 AM    HDL Cholesterol 60 06/27/2018 05:06 AM    LDL, calculated 183.2 (H) 06/27/2018 05:06 AM    Triglyceride 104 06/27/2018 05:06 AM    CHOL/HDL Ratio 4.4 06/27/2018 05:06 AM       Thyroid Function  Lab Results   Component Value Date/Time    TSH 6.43 (H) 06/27/2018 05:06 AM     Vitals  Visit Vitals  BP (!) 79/46   Pulse 77   Temp 97.9 ??F (36.6 ??C)   Resp 16   Ht 167.6 cm (66")   Wt 63.5 kg (140 lb)   SpO2 99%   BMI 22.60 kg/m??     Louis Matte, PharmD, BCPS  [phone] (pharmacy)

## 2018-06-29 NOTE — Behavioral Health Treatment Team (Cosign Needed)
GROUP THERAPY PROGRESS NOTE    The patient Kenneth Hill a 22 y.o. male is participating in Creative Expression Group.     Group time: 1 hour    Personal goal for participation: To concentrate on selected task    Goal orientation: social    Group therapy participation: minimal    Therapeutic interventions reviewed and discussed: Crafts, games, music    Impression of participation: The patient was present-left early    [name]  06/29/2018  6:29 PM

## 2018-06-29 NOTE — Behavioral Health Treatment Team (Signed)
Patient slept 8.75 hours

## 2018-06-29 NOTE — Behavioral Health Treatment Team (Cosign Needed)
Pt did not attend coping skills group.

## 2018-06-29 NOTE — Behavioral Health Treatment Team (Signed)
PSYCHIATRIC PROGRESS NOTE          IDENTIFICATION:    Patient Name  Kenneth Hill   Date of Birth Sep 10, 1996   CSN [id number]   Medical Record Number  [id number]      Age  22 y.o.   PCP None   Admit date:  06/26/2018    Room Number  312/01  @ [facility]   Date of Service  06/29/2018            HISTORY         REASON FOR HOSPITALIZATION:  CC:  Pt admitted under a temporary detention order (TDO) with severe psychosis and proving to be an imminent danger to self and others.    HISTORY OF PRESENT ILLNESS:    The patient, Kenneth Hill, is a 22 y.o.  [place] male with a past psychiatric history significant for Psychosis, who presents at this time with complaints of (and/or evidence of) the following emotional symptoms: agitation, delusions, paranoid behavior, anxiety and psychosis.  Additional symptomatology include anxiety, fearfulness, poor concentration and problem with medication.  The above symptoms have been present for a week. These symptoms are of high severity. These symptoms are constant and intermittent/ fleeting in nature.  The patient's condition has been precipitated by multiple psychosocial stressors .  Patient's condition made worse by continued illicit drug use as well as treatment noncompliance. UDS: +MJ, Amphetamines; BAL=0.   Pt is [place] and just returned from [place]. He presents very distractible. He has been on various anti-psychotics in the past and refuses to start one on. Reluctantly agreed to a trial of Seroquel.  7/7-Appears less anxious today but is very doubtful about taking Psych meds. Still has delusional thoughts. Slept 4 hrs. Prn used Atarax. Mom visited yesterday.  7/8- patient has been visible, continues to be grossly disorganized. Patient with ongoing delusions per nursing; tolerating medication thus far. Denies SI/HI/AVH on interview. Endorses MJ use.  7/9- no acute overnight  Events. Patient isolative, odd. He states that  Seroquel is too strong. Slept 8.75 hours. Patient denies AVH but is noted to be internally preoccupied. Discharge focused on interview. Patient amenable to starting XR formulation of Seroquel for ongoing symptom control though he is wary of the higher dose.     ALLERGIES:   Allergies   Allergen Reactions   ??? Aspirin Swelling      MEDICATIONS PRIOR TO ADMISSION:   No medications prior to admission.      PAST MEDICAL HISTORY:   No past medical history on file.No past surgical history on file.   SOCIAL HISTORY: Per SW note   Social History     Socioeconomic History   ??? Marital status: SINGLE     Spouse name: Not on file   ??? Number of children: Not on file   ??? Years of education: Not on file   ??? Highest education level: Not on file   Occupational History   ??? Not on file   Social Needs   ??? Financial resource strain: Not on file   ??? Food insecurity:     Worry: Not on file     Inability: Not on file   ??? Transportation needs:     Medical: Not on file     Non-medical: Not on file   Tobacco Use   ??? Smoking status: Not on file   Substance and Sexual Activity   ??? Alcohol use: Not on file   ??? Drug  use: Not on file   ??? Sexual activity: Not on file   Lifestyle   ??? Physical activity:     Days per week: Not on file     Minutes per session: Not on file   ??? Stress: Not on file   Relationships   ??? Social connections:     Talks on phone: Not on file     Gets together: Not on file     Attends religious service: Not on file     Active member of club or organization: Not on file     Attends meetings of clubs or organizations: Not on file     Relationship status: Not on file   ??? Intimate partner violence:     Fear of current or ex partner: Not on file     Emotionally abused: Not on file     Physically abused: Not on file     Forced sexual activity: Not on file   Other Topics Concern   ??? Not on file   Social History Narrative   ??? Not on file      FAMILY HISTORY: History reviewed. No pertinent family history.   No family history on file.     REVIEW OF SYSTEMS:   Psychological ROS: positive for - anxiety, concentration difficulties and sleep disturbances  negative for - disorientation, hallucinations or hostility  Pertinent items are noted in the History of Present Illness.  All other Systems reviewed and are considered negative.           MENTAL STATUS EXAM & VITALS     MENTAL STATUS EXAM (MSE):    MSE FINDINGS ARE WITHIN NORMAL LIMITS (WNL) UNLESS OTHERWISE STATED BELOW. ( ALL OF THE BELOW CATEGORIES OF THE MSE HAVE BEEN REVIEWED (reviewed 06/29/2018) AND UPDATED AS DEEMED APPROPRIATE )  General Presentation age appropriate and casually dressed, cooperative   Orientation oriented to time, place and person   Vital Signs  See below (reviewed 06/29/2018); Vital Signs (BP, Pulse, & Temp) are within normal limits if not listed below.   Gait and Station Stable/steady, no ataxia   Musculoskeletal System No extrapyramidal symptoms (EPS); no abnormal muscular movements or Tardive Dyskinesia (TD); muscle strength and tone are within normal limits   Language No aphasia or dysarthria   Speech:  hypoverbal   Thought Processes Illogical; normal rate of thoughts; poor abstract reasoning/computation   Thought Associations circumstantial   Thought Content paranoid delusions and preoccupations   Suicidal Ideations none   Homicidal Ideations none   Mood:  anxious    Affect:  sad   Memory recent  good   Memory remote:  good   Concentration/Attention:  distractable   Fund of Knowledge average   Insight:  limited   Reliability poor   Judgment:  limited          VITALS:     Patient Vitals for the past 24 hrs:   Temp Pulse Resp BP SpO2   06/29/18 0823 97.9 ??F (36.6 ??C) 77 16 (!) 79/46 99 %   06/28/18 2035 98 ??F (36.7 ??C) 60 16 110/72 99 %     Wt Readings from Last 3 Encounters:   06/26/18 63.5 kg (140 lb)     Temp Readings from Last 3 Encounters:   06/29/18 97.9 ??F (36.6 ??C)     BP Readings from Last 3 Encounters:   06/29/18 (!) 79/46     Pulse Readings from Last 3 Encounters:    06/29/18 77  DATA     LABORATORY DATA:  Labs Reviewed   TSH 3RD GENERATION - Abnormal; Notable for the following components:       Result Value    TSH 6.43 (*)     All other components within normal limits   LIPID PANEL - Abnormal; Notable for the following components:    Cholesterol, total 264 (*)     LDL, calculated 183.2 (*)     All other components within normal limits   GLUCOSE, FASTING     Admission on 06/26/2018   Component Date Value Ref Range Status   ??? Glucose 06/27/2018 98  65 - 100 MG/DL Final   ??? TSH 16/09/9603 6.43* 0.36 - 3.74 uIU/mL Final   ??? LIPID PROFILE 06/27/2018        Final   ??? Cholesterol, total 06/27/2018 264* <200 MG/DL Final   ??? Triglyceride 06/27/2018 104  <150 MG/DL Final   ??? HDL Cholesterol 06/27/2018 60  MG/DL Final   ??? LDL, calculated 06/27/2018 183.2* 0 - 100 MG/DL Final   ??? VLDL, calculated 06/27/2018 20.8  MG/DL Final   ??? CHOL/HDL Ratio 06/27/2018 4.4  0.0 - 5.0   Final        RADIOLOGY REPORTS:  No results found for this or any previous visit.No results found.           MEDICATIONS       ALL MEDICATIONS  Current Facility-Administered Medications   Medication Dose Route Frequency   ??? QUEtiapine SR (SEROquel XR) tablet 300 mg  300 mg Oral QHS   ??? ziprasidone (GEODON) 20 mg in sterile water (preservative free) 1 mL injection  20 mg IntraMUSCular BID PRN   ??? OLANZapine (ZyPREXA) tablet 5 mg  5 mg Oral Q6H PRN   ??? benztropine (COGENTIN) tablet 2 mg  2 mg Oral BID PRN   ??? benztropine (COGENTIN) injection 2 mg  2 mg IntraMUSCular BID PRN   ??? LORazepam (ATIVAN) injection 2 mg  2 mg IntraMUSCular Q4H PRN   ??? acetaminophen (TYLENOL) tablet 650 mg  650 mg Oral Q4H PRN   ??? magnesium hydroxide (MILK OF MAGNESIA) 400 mg/5 mL oral suspension 30 mL  30 mL Oral DAILY PRN   ??? nicotine (NICODERM CQ) 21 mg/24 hr patch 1 Patch  1 Patch TransDERmal DAILY PRN   ??? hydrOXYzine HCl (ATARAX) tablet 50 mg  50 mg Oral Q6H PRN   ??? traZODone (DESYREL) tablet 50 mg  50 mg Oral QHS PRN       SCHEDULED MEDICATIONS  Current Facility-Administered Medications   Medication Dose Route Frequency   ??? QUEtiapine SR (SEROquel XR) tablet 300 mg  300 mg Oral QHS              ASSESSMENT & PLAN        The patient, Corran Lalone Kemp, is a 22 y.o.  male who presents at this time for treatment of the following diagnoses:  Patient Active Hospital Problem List:   Schizoaffective disorder (HCC) (06/26/2018)    Assessment: Presents distractible, denies current AVH, paranoid delusions reported at the time of ER eval.    Plan: INCREASE and CHANGE Seroquel to XR 300 mg QHS for psychosis         A coordinated, multidisplinary treatment team (includes the nurse, unit pharmcist, Administrator) round was conducted for this initial evaluation with the patient present.     The following regarding medications was addressed during rounds with patient:  the risks and benefits of the proposed medication. The patient was given the opportunity to ask questions. Informed consent given to the use of the above medications.     I will continue to adjust psychiatric and non-psychiatric medications (see above "medication" section and orders section for details) as deemed appropriate & based upon diagnoses and response to treatment.     I have reviewed admission (and previous/old) labs and medical tests in the EHR and or transferring hospital documents. I will continue to order blood tests/labs and diagnostic tests as deemed appropriate and review results as they become available (see orders for details).    I have reviewed old psychiatric and medical records available in the EHR. I Will order additional psychiatric records from other institutions to further elucidate the nature of patient's psychopathology and review once available.    I will gather additional collateral information from friends, family and o/p treatment team to further elucidate the nature of patient's psychopathology and baselline level of psychiatric functioning.       ESTIMATED LENGTH OF STAY:  06/30/2018       STRENGTHS:  Access to housing/residential stability, Interpersonal/supportive relationships (family, friends, peers) and Awareness of Substance abuse issues                                        SIGNED:    Hiram Gashsitadinma L Kenechukwu Eckstein, MD  06/29/2018

## 2018-06-29 NOTE — Behavioral Health Treatment Team (Addendum)
No changes to mood or behavior. Patient isolates to him room, only coming out to retrieve meals or medications. Patient states he feels his increased dosage of Seroquel is too much for him and he would like the dose to be lowered back to 50 mg at HS.  Denies any current feelings of SI or HI. No auditory or visual hallucinations. Q 15 minute safety checks continue.

## 2018-06-29 NOTE — Behavioral Health Treatment Team (Signed)
Report received from Tameika, RN at 0730.  Patient currently in bed sleeping quietly.  Patient continues to be isolative to room.  Patient did attend breakfast in the dayroom.       1402  Patient in room sleeping.    1743  Patient in dayroom watching TV.  Patient less isolative today.

## 2018-06-29 NOTE — Behavioral Health Treatment Team (Cosign Needed)
Nurse notified of low bp

## 2018-06-29 NOTE — Behavioral Health Treatment Team (Signed)
PSYCHIATRIC PROGRESS NOTE            IDENTIFICATION:    Patient Name  Kenneth Hill   Date of Birth 03-13-1996   CSN [id number]   Medical Record Number  [id number]      Age  22 y.o.   PCP None   Admit date:  06/26/2018    Room Number  312/01  @ [facility]   Date of Service  06/29/2018            HISTORY         REASON FOR HOSPITALIZATION:  CC:  Pt admitted under a temporary detention order (TDO) with severe psychosis and proving to be an imminent danger to self and others.    HISTORY OF PRESENT ILLNESS:    The patient, Kenneth Hill, is a 22 y.o.  [place] male with a past psychiatric history significant for Psychosis, who presents at this time with complaints of (and/or evidence of) the following emotional symptoms: agitation, delusions, paranoid behavior, anxiety and psychosis.  Additional symptomatology include anxiety, fearfulness, poor concentration and problem with medication.  The above symptoms have been present for a week. These symptoms are of high severity. These symptoms are constant and intermittent/ fleeting in nature.  The patient's condition has been precipitated by multiple psychosocial stressors .  Patient's condition made worse by continued illicit drug use as well as treatment noncompliance. UDS: +MJ, Amphetamines; BAL=0.   Pt is a [place] and just returned from [place]. He presents very distractible. He has been on various anti-psychotics in the past and refuses to start one on. Reluctantly agreed to a trial of Seroquel.  7/7-Appears less anxious today but is very doubtful about taking Psych meds. Still has delusional thoughts. Slept 4 hrs. Prn used Atarax. Mom visited yesterday.  7/8- patient has been visible, continues to be grossly disorganized. Patient with ongoing delusions per nursing; tolerating medication thus far. Denies SI/HI/AVH on interview. Endorses MJ use.  7/9- no acute overnight  Events. Patient isolative, odd. He states that Seroquel is too strong. Slept  8.75 hours. Patient denies AVH but is noted to be internally preoccupied. Discharge focused on interview. Patient amenable to starting XR formulation of Seroquel for ongoing symptom control though he is wary of the higher dose.     ALLERGIES:   Allergies   Allergen Reactions   ??? Aspirin Swelling      MEDICATIONS PRIOR TO ADMISSION:   No medications prior to admission.      PAST MEDICAL HISTORY:   No past medical history on file.No past surgical history on file.   SOCIAL HISTORY: Per SW note   Social History     Socioeconomic History   ??? Marital status: SINGLE     Spouse name: Not on file   ??? Number of children: Not on file   ??? Years of education: Not on file   ??? Highest education level: Not on file   Occupational History   ??? Not on file   Social Needs   ??? Financial resource strain: Not on file   ??? Food insecurity:     Worry: Not on file     Inability: Not on file   ??? Transportation needs:     Medical: Not on file     Non-medical: Not on file   Tobacco Use   ??? Smoking status: Not on file   Substance and Sexual Activity   ??? Alcohol use: Not on file   ???  Drug use: Not on file   ??? Sexual activity: Not on file   Lifestyle   ??? Physical activity:     Days per week: Not on file     Minutes per session: Not on file   ??? Stress: Not on file   Relationships   ??? Social connections:     Talks on phone: Not on file     Gets together: Not on file     Attends religious service: Not on file     Active member of club or organization: Not on file     Attends meetings of clubs or organizations: Not on file     Relationship status: Not on file   ??? Intimate partner violence:     Fear of current or ex partner: Not on file     Emotionally abused: Not on file     Physically abused: Not on file     Forced sexual activity: Not on file   Other Topics Concern   ??? Not on file   Social History Narrative   ??? Not on file      FAMILY HISTORY: History reviewed. No pertinent family history.   No family history on file.    REVIEW OF SYSTEMS:    Psychological ROS: positive for - anxiety, concentration difficulties and sleep disturbances  negative for - disorientation, hallucinations or hostility  Pertinent items are noted in the History of Present Illness.  All other Systems reviewed and are considered negative.           MENTAL STATUS EXAM & VITALS     MENTAL STATUS EXAM (MSE):    MSE FINDINGS ARE WITHIN NORMAL LIMITS (WNL) UNLESS OTHERWISE STATED BELOW. ( ALL OF THE BELOW CATEGORIES OF THE MSE HAVE BEEN REVIEWED (reviewed 06/29/2018) AND UPDATED AS DEEMED APPROPRIATE )  General Presentation age appropriate and casually dressed, cooperative   Orientation oriented to time, place and person   Vital Signs  See below (reviewed 06/29/2018); Vital Signs (BP, Pulse, & Temp) are within normal limits if not listed below.   Gait and Station Stable/steady, no ataxia   Musculoskeletal System No extrapyramidal symptoms (EPS); no abnormal muscular movements or Tardive Dyskinesia (TD); muscle strength and tone are within normal limits   Language No aphasia or dysarthria   Speech:  hypoverbal   Thought Processes Illogical; normal rate of thoughts; poor abstract reasoning/computation   Thought Associations circumstantial   Thought Content paranoid delusions and preoccupations   Suicidal Ideations none   Homicidal Ideations none   Mood:  anxious    Affect:  sad   Memory recent  good   Memory remote:  good   Concentration/Attention:  distractable   Fund of Knowledge average   Insight:  limited   Reliability poor   Judgment:  limited          VITALS:     Patient Vitals for the past 24 hrs:   Temp Pulse Resp BP SpO2   06/29/18 0823 97.9 ??F (36.6 ??C) 77 16 (!) 79/46 99 %   06/28/18 2035 98 ??F (36.7 ??C) 60 16 110/72 99 %     Wt Readings from Last 3 Encounters:   06/26/18 63.5 kg (140 lb)     Temp Readings from Last 3 Encounters:   06/29/18 97.9 ??F (36.6 ??C)     BP Readings from Last 3 Encounters:   06/29/18 (!) 79/46     Pulse Readings from Last 3 Encounters:   06/29/18 77  DATA     LABORATORY DATA:  Labs Reviewed   TSH 3RD GENERATION - Abnormal; Notable for the following components:       Result Value    TSH 6.43 (*)     All other components within normal limits   LIPID PANEL - Abnormal; Notable for the following components:    Cholesterol, total 264 (*)     LDL, calculated 183.2 (*)     All other components within normal limits   GLUCOSE, FASTING     Admission on 06/26/2018   Component Date Value Ref Range Status   ??? Glucose 06/27/2018 98  65 - 100 MG/DL Final   ??? TSH 16/09/9603 6.43* 0.36 - 3.74 uIU/mL Final   ??? LIPID PROFILE 06/27/2018        Final   ??? Cholesterol, total 06/27/2018 264* <200 MG/DL Final   ??? Triglyceride 06/27/2018 104  <150 MG/DL Final   ??? HDL Cholesterol 06/27/2018 60  MG/DL Final   ??? LDL, calculated 06/27/2018 183.2* 0 - 100 MG/DL Final   ??? VLDL, calculated 06/27/2018 20.8  MG/DL Final   ??? CHOL/HDL Ratio 06/27/2018 4.4  0.0 - 5.0   Final        RADIOLOGY REPORTS:  No results found for this or any previous visit.No results found.           MEDICATIONS       ALL MEDICATIONS  Current Facility-Administered Medications   Medication Dose Route Frequency   ??? QUEtiapine SR (SEROquel XR) tablet 300 mg  300 mg Oral QHS   ??? ziprasidone (GEODON) 20 mg in sterile water (preservative free) 1 mL injection  20 mg IntraMUSCular BID PRN   ??? OLANZapine (ZyPREXA) tablet 5 mg  5 mg Oral Q6H PRN   ??? benztropine (COGENTIN) tablet 2 mg  2 mg Oral BID PRN   ??? benztropine (COGENTIN) injection 2 mg  2 mg IntraMUSCular BID PRN   ??? LORazepam (ATIVAN) injection 2 mg  2 mg IntraMUSCular Q4H PRN   ??? acetaminophen (TYLENOL) tablet 650 mg  650 mg Oral Q4H PRN   ??? magnesium hydroxide (MILK OF MAGNESIA) 400 mg/5 mL oral suspension 30 mL  30 mL Oral DAILY PRN   ??? nicotine (NICODERM CQ) 21 mg/24 hr patch 1 Patch  1 Patch TransDERmal DAILY PRN   ??? hydrOXYzine HCl (ATARAX) tablet 50 mg  50 mg Oral Q6H PRN   ??? traZODone (DESYREL) tablet 50 mg  50 mg Oral QHS PRN      SCHEDULED  MEDICATIONS  Current Facility-Administered Medications   Medication Dose Route Frequency   ??? QUEtiapine SR (SEROquel XR) tablet 300 mg  300 mg Oral QHS                ASSESSMENT & PLAN        The patient, Kenneth Hill, is a 22 y.o.  male who presents at this time for treatment of the following diagnoses:  Patient Active Hospital Problem List:   Schizoaffective disorder (HCC) (06/26/2018)    Assessment: Presents distractible, denies current AVH, paranoid delusions reported at the time of ER eval.    Plan: INCREASE and CHANGE Seroquel to XR 300 mg QHS for psychosis         A coordinated, multidisplinary treatment team (includes the nurse, unit pharmcist, Administrator) round was conducted for this initial evaluation with the patient present.     The following regarding medications was addressed during rounds with patient:  the risks and benefits of the proposed medication. The patient was given the opportunity to ask questions. Informed consent given to the use of the above medications.     I will continue to adjust psychiatric and non-psychiatric medications (see above "medication" section and orders section for details) as deemed appropriate & based upon diagnoses and response to treatment.     I have reviewed admission (and previous/old) labs and medical tests in the EHR and or transferring hospital documents. I will continue to order blood tests/labs and diagnostic tests as deemed appropriate and review results as they become available (see orders for details).    I have reviewed old psychiatric and medical records available in the EHR. I Will order additional psychiatric records from other institutions to further elucidate the nature of patient's psychopathology and review once available.    I will gather additional collateral information from friends, family and o/p treatment team to further elucidate the nature of patient's psychopathology and baselline level of psychiatric functioning.      ESTIMATED  LENGTH OF STAY:  06/30/2018       STRENGTHS:  Access to housing/residential stability, Interpersonal/supportive relationships (family, friends, peers) and Awareness of Substance abuse issues                                        SIGNED:    Hiram Gash, MD  06/29/2018

## 2018-06-29 NOTE — Behavioral Health Treatment Team (Deleted)
Nurse notified of low bp

## 2018-06-29 NOTE — Behavioral Health Treatment Team (Deleted)
Pt did not attend coping skills group.

## 2018-06-29 NOTE — Progress Notes (Signed)
 Laboratory monitoring for mood stabilizer and antipsychotics:    Recommended baseline monitoring has been completed based on this patient's current medication regimen. Please note that patient's TSH is elevated and patient's LDL & total cholesterol are elevated.     The following labs were completed at transferring facility: WBC 15.04 (elevated), Hgb 17.2, Hct 49.8, Plts 247, sodium 136, potassium 3.7, BUN 9, creatinine 0.93, glucose 118, calcium 9.7, AST 36, ALT 38, alk phos 92, total bili 1.1, albumin 5.2 (elevated), UDS (+) amphetamines & THC     The patient is currently taking the following medication(s):   Current Facility-Administered Medications   Medication Dose Route Frequency    QUEtiapine (SEROquel) tablet 100 mg  100 mg Oral QHS       Height, Weight, BMI Estimation  Estimated body mass index is 22.6 kg/m as calculated from the following:    Height as of this encounter: 167.6 cm (66).    Weight as of this encounter: 63.5 kg (140 lb).     Renal Function, Hepatic Function and Chemistry  See above    Lab Results   Component Value Date/Time    Glucose 98 06/27/2018 05:06 AM       Lab Results   Component Value Date/Time    Glucose 98 06/27/2018 05:06 AM       Hematology  See above    Lipids  Lab Results   Component Value Date/Time    Cholesterol, total 264 (H) 06/27/2018 05:06 AM    HDL Cholesterol 60 06/27/2018 05:06 AM    LDL, calculated 183.2 (H) 92/92/7980 05:06 AM    Triglyceride 104 06/27/2018 05:06 AM    CHOL/HDL Ratio 4.4 06/27/2018 05:06 AM       Thyroid Function    Lab Results   Component Value Date/Time    TSH 6.43 (H) 06/27/2018 05:06 AM     Vitals  Visit Vitals  BP (!) 79/46   Pulse 77   Temp 97.9 F (36.6 C)   Resp 16   Ht 167.6 cm (66)   Wt 63.5 kg (140 lb)   SpO2 99%   BMI 22.60 kg/m       Rumalda Repine, PharmD, BCPS  [phone] (pharmacy)

## 2018-06-29 NOTE — Behavioral Health Treatment Team (Deleted)
GROUP THERAPY PROGRESS NOTE    The patient Kenneth Hill a 22 y.o. male is participating in Creative Expression Group.     Group time: 1 hour    Personal goal for participation: To concentrate on selected task    Goal orientation: social    Group therapy participation: minimal    Therapeutic interventions reviewed and discussed: Crafts, games, music    Impression of participation: The patient was present-left early    Audelia HivesBeverly S Baker  06/29/2018  6:29 PM

## 2018-06-29 NOTE — Behavioral Health Treatment Team (Signed)
No changes to mood or behavior. Patient isolates to him room, only coming out to retrieve meals or medications. Patient states he feels his increased dosage of Seroquel is too much for him and he would like the dose to be lowered back to 50 mg at HS.  Denies any current feelings of SI or HI. No auditory or visual hallucinations. Q 15 minute safety checks continue.

## 2018-06-29 NOTE — Behavioral Health Treatment Team (Signed)
Report received from [place], RN at 0730.  Patient currently in bed sleeping quietly.  Patient continues to be isolative to room.  Patient did attend breakfast in the dayroom.       1402  Patient in room sleeping.    1743  Patient in dayroom watching TV.  Patient less isolative today.

## 2018-06-30 MED FILL — SEROQUEL XR 300 MG TABLET,EXTENDED RELEASE: 300 mg | ORAL | Qty: 1

## 2018-06-30 NOTE — Behavioral Health Treatment Team (Cosign Needed)
Pt did not attend coping skills group.

## 2018-06-30 NOTE — Behavioral Health Treatment Team (Signed)
Patient isolative to his room this evening. He denies any current feelings of SI or HI. Writer provided verbal education to patient about Seroquel. We discuss what the medication does and how a higher dose may cause him to feel drowsy, but will help control the symptoms. He agreed to accept the increased dose of Seroquel. He had a visit with family members this evening. The visit appeared to get intense as patient and male visitor began to raise their voices. Staff defused the situation and he was able to continue his visitation. Pt declined to talk about his visit when it was over. Q 15 minute safety checks continue.

## 2018-06-30 NOTE — Behavioral Health Treatment Team (Addendum)
PSYCHIATRIC PROGRESS NOTE          IDENTIFICATION:    Patient Name  Kenneth Hill   Date of Birth 06-18-1996   CSN [id number]   Medical Record Number  [id number]      Age  22 y.o.   PCP None   Admit date:  06/26/2018    Room Number  312/01  @ [facility]   Date of Service  06/30/2018            HISTORY         REASON FOR HOSPITALIZATION:  CC:  Pt admitted under a temporary detention order (TDO) with severe psychosis and proving to be an imminent danger to self and others.    HISTORY OF PRESENT ILLNESS:    The patient, Kenneth Hill, is a 22 y.o.  [place] male with a past psychiatric history significant for Psychosis, who presents at this time with complaints of (and/or evidence of) the following emotional symptoms: agitation, delusions, paranoid behavior, anxiety and psychosis.  Additional symptomatology include anxiety, fearfulness, poor concentration and problem with medication.  The above symptoms have been present for a week. These symptoms are of high severity. These symptoms are constant and intermittent/ fleeting in nature.  The patient's condition has been precipitated by multiple psychosocial stressors .  Patient's condition made worse by continued illicit drug use as well as treatment noncompliance. UDS: +MJ, Amphetamines; BAL=0.   Pt [place] and just returned from [place]. He presents very distractible. He has been on various anti-psychotics in the past and refuses to start one on. Reluctantly agreed to a trial of Seroquel.  7/7-Appears less anxious today but is very doubtful about taking Psych meds. Still has delusional thoughts. Slept 4 hrs. Prn used Atarax. Mom visited yesterday.  7/8- patient has been visible, continues to be grossly disorganized. Patient with ongoing delusions per nursing; tolerating medication thus far. Denies SI/HI/AVH on interview. Endorses MJ use.  7/9- no acute overnight  Events. Patient isolative, odd. He states that  Seroquel is too strong. Slept 8.75 hours. Patient denies AVH but is noted to be internally preoccupied. Discharge focused on interview. Patient amenable to starting XR formulation of Seroquel for ongoing symptom control though he is wary of the higher dose.  7/10- no acute overnight events, patient visited by family, had a verbal altercation with brother who left afterwards per nursing. Patient medication compliant, complains of sedation from Seroquel but otherwise is more coherent. Discharge focused, no complaints. Still disorganized and grossly internally preoccupied.     ALLERGIES:   Allergies   Allergen Reactions   ??? Aspirin Swelling      MEDICATIONS PRIOR TO ADMISSION:   No medications prior to admission.      PAST MEDICAL HISTORY:   No past medical history on file.No past surgical history on file.   SOCIAL HISTORY: Per SW note   Social History     Socioeconomic History   ??? Marital status: SINGLE     Spouse name: Not on file   ??? Number of children: Not on file   ??? Years of education: Not on file   ??? Highest education level: Not on file   Occupational History   ??? Not on file   Social Needs   ??? Financial resource strain: Not on file   ??? Food insecurity:     Worry: Not on file     Inability: Not on file   ??? Transportation needs:  Medical: Not on file     Non-medical: Not on file   Tobacco Use   ??? Smoking status: Not on file   Substance and Sexual Activity   ??? Alcohol use: Not on file   ??? Drug use: Not on file   ??? Sexual activity: Not on file   Lifestyle   ??? Physical activity:     Days per week: Not on file     Minutes per session: Not on file   ??? Stress: Not on file   Relationships   ??? Social connections:     Talks on phone: Not on file     Gets together: Not on file     Attends religious service: Not on file     Active member of club or organization: Not on file     Attends meetings of clubs or organizations: Not on file     Relationship status: Not on file   ??? Intimate partner violence:      Fear of current or ex partner: Not on file     Emotionally abused: Not on file     Physically abused: Not on file     Forced sexual activity: Not on file   Other Topics Concern   ??? Not on file   Social History Narrative   ??? Not on file      FAMILY HISTORY: History reviewed. No pertinent family history.   No family history on file.    REVIEW OF SYSTEMS:   Psychological ROS: positive for - anxiety, concentration difficulties and sleep disturbances  negative for - disorientation, hallucinations or hostility  Pertinent items are noted in the History of Present Illness.  All other Systems reviewed and are considered negative.           MENTAL STATUS EXAM & VITALS     MENTAL STATUS EXAM (MSE):    MSE FINDINGS ARE WITHIN NORMAL LIMITS (WNL) UNLESS OTHERWISE STATED BELOW. ( ALL OF THE BELOW CATEGORIES OF THE MSE HAVE BEEN REVIEWED (reviewed 06/30/2018) AND UPDATED AS DEEMED APPROPRIATE )  General Presentation age appropriate and casually dressed, cooperative   Orientation oriented to time, place and person   Vital Signs  See below (reviewed 06/30/2018); Vital Signs (BP, Pulse, & Temp) are within normal limits if not listed below.   Gait and Station Stable/steady, no ataxia   Musculoskeletal System No extrapyramidal symptoms (EPS); no abnormal muscular movements or Tardive Dyskinesia (TD); muscle strength and tone are within normal limits   Language No aphasia or dysarthria   Speech:  hypoverbal   Thought Processes coherent; normal rate of thoughts; poor abstract reasoning/computation   Thought Associations circumstantial   Thought Content internally preoccupied   Suicidal Ideations none   Homicidal Ideations none   Mood:  anxious    Affect:  sad   Memory recent  good   Memory remote:  good   Concentration/Attention:  distractable   Fund of Knowledge average   Insight:  limited   Reliability poor   Judgment:  limited          VITALS:     No data found.  Wt Readings from Last 3 Encounters:   06/26/18 63.5 kg (140 lb)      Temp Readings from Last 3 Encounters:   06/29/18 97.9 ??F (36.6 ??C)     BP Readings from Last 3 Encounters:   06/29/18 (!) 79/46     Pulse Readings from Last 3 Encounters:   06/29/18 77  DATA     LABORATORY DATA:  Labs Reviewed   TSH 3RD GENERATION - Abnormal; Notable for the following components:       Result Value    TSH 6.43 (*)     All other components within normal limits   LIPID PANEL - Abnormal; Notable for the following components:    Cholesterol, total 264 (*)     LDL, calculated 183.2 (*)     All other components within normal limits   GLUCOSE, FASTING     Admission on 06/26/2018   Component Date Value Ref Range Status   ??? Glucose 06/27/2018 98  65 - 100 MG/DL Final   ??? TSH 16/09/9603 6.43* 0.36 - 3.74 uIU/mL Final   ??? LIPID PROFILE 06/27/2018        Final   ??? Cholesterol, total 06/27/2018 264* <200 MG/DL Final   ??? Triglyceride 06/27/2018 104  <150 MG/DL Final   ??? HDL Cholesterol 06/27/2018 60  MG/DL Final   ??? LDL, calculated 06/27/2018 183.2* 0 - 100 MG/DL Final   ??? VLDL, calculated 06/27/2018 20.8  MG/DL Final   ??? CHOL/HDL Ratio 06/27/2018 4.4  0.0 - 5.0   Final        RADIOLOGY REPORTS:  No results found for this or any previous visit.No results found.           MEDICATIONS       ALL MEDICATIONS  Current Facility-Administered Medications   Medication Dose Route Frequency   ??? QUEtiapine SR (SEROquel XR) tablet 300 mg  300 mg Oral QHS   ??? ziprasidone (GEODON) 20 mg in sterile water (preservative free) 1 mL injection  20 mg IntraMUSCular BID PRN   ??? OLANZapine (ZyPREXA) tablet 5 mg  5 mg Oral Q6H PRN   ??? benztropine (COGENTIN) tablet 2 mg  2 mg Oral BID PRN   ??? benztropine (COGENTIN) injection 2 mg  2 mg IntraMUSCular BID PRN   ??? LORazepam (ATIVAN) injection 2 mg  2 mg IntraMUSCular Q4H PRN   ??? acetaminophen (TYLENOL) tablet 650 mg  650 mg Oral Q4H PRN   ??? magnesium hydroxide (MILK OF MAGNESIA) 400 mg/5 mL oral suspension 30 mL  30 mL Oral DAILY PRN    ??? nicotine (NICODERM CQ) 21 mg/24 hr patch 1 Patch  1 Patch TransDERmal DAILY PRN   ??? hydrOXYzine HCl (ATARAX) tablet 50 mg  50 mg Oral Q6H PRN   ??? traZODone (DESYREL) tablet 50 mg  50 mg Oral QHS PRN      SCHEDULED MEDICATIONS  Current Facility-Administered Medications   Medication Dose Route Frequency   ??? QUEtiapine SR (SEROquel XR) tablet 300 mg  300 mg Oral QHS              ASSESSMENT & PLAN        The patient, Kenneth Hill, is a 22 y.o.  male who presents at this time for treatment of the following diagnoses:  Patient Active Hospital Problem List:   Schizoaffective disorder (HCC) (06/26/2018)    Assessment: Presents distractible, denies current AVH, paranoid delusions reported at the time of ER eval.    Plan:   - CONTINUE Seroquel XR 300 mg QHS for psychosis  - Discharge planning         A coordinated, multidisplinary treatment team (includes the nurse, unit pharmcist, Administrator) round was conducted for this initial evaluation with the patient present.     The following regarding medications was addressed during rounds  with patient:   the risks and benefits of the proposed medication. The patient was given the opportunity to ask questions. Informed consent given to the use of the above medications.     I will continue to adjust psychiatric and non-psychiatric medications (see above "medication" section and orders section for details) as deemed appropriate & based upon diagnoses and response to treatment.     I have reviewed admission (and previous/old) labs and medical tests in the EHR and or transferring hospital documents. I will continue to order blood tests/labs and diagnostic tests as deemed appropriate and review results as they become available (see orders for details).    I have reviewed old psychiatric and medical records available in the EHR. I Will order additional psychiatric records from other institutions to further elucidate the nature of patient's psychopathology and review once  available.    I will gather additional collateral information from friends, family and o/p treatment team to further elucidate the nature of patient's psychopathology and baselline level of psychiatric functioning.      ESTIMATED LENGTH OF STAY:  07/02/2018       STRENGTHS:  Access to housing/residential stability, Interpersonal/supportive relationships (family, friends, peers) and Awareness of Substance abuse issues                                        SIGNED:    Hiram Gashsitadinma L Eimy Plaza, MD  06/30/2018

## 2018-06-30 NOTE — Behavioral Health Treatment Team (Incomplete)
0715 REPORT RECEIVED FROM RN. ASSUMED CARE OF Kenneth Hill. HE IS ALERT AND ORIENTED, CALM AND COOPERATIVE. DENIES SI, HI, AND A/V/H. DENIES PAIN AT THIS TIME. LOOKS DEPRESSED HOWEVER VERY COOPERATIVE. MEDICATION COMPLIANT.

## 2018-06-30 NOTE — Behavioral Health Treatment Team (Cosign Needed)
GROUP THERAPY PROGRESS NOTE    The patient Kenneth Hill a 22 y.o. male is participating in Creative Expression Group.     Group time: 1 hour    Personal goal for participation: To concentrate on selected task    Goal orientation: social    Group therapy participation: minimal    Therapeutic interventions reviewed and discussed: Crafts, games, music    Impression of participation: The patient was present-left early    [name]  06/30/2018  6:36 PM

## 2018-06-30 NOTE — Behavioral Health Treatment Team (Signed)
Patient slept 8. 25 hours

## 2018-06-30 NOTE — Behavioral Health Treatment Team (Addendum)
Patient staying in his room except for snacks and medication,Patient is denying any delusions ,hallucinations, SI/HI no delusional behaviors or speech noted.  Hourly rounding done  Slept 9 hours

## 2018-06-30 NOTE — Behavioral Health Treatment Team (Signed)
Patient staying in his room except for snacks and medication,Patient is denying any delusions ,hallucinations, SI/HI no delusional behaviors or speech noted.  Hourly rounding done  Slept 9 hours

## 2018-06-30 NOTE — Behavioral Health Treatment Team (Signed)
PSYCHIATRIC PROGRESS NOTE            IDENTIFICATION:    Patient Name  Kenneth Hill   Date of Birth 1996-09-09   CSN [id number]   Medical Record Number  [id number]      Age  22 y.o.   PCP None   Admit date:  06/26/2018    Room Number  312/01  @ [facility]   Date of Service  06/30/2018            HISTORY         REASON FOR HOSPITALIZATION:  CC:  Pt admitted under a temporary detention order (TDO) with severe psychosis and proving to be an imminent danger to self and others.    HISTORY OF PRESENT ILLNESS:    The patient, Kenneth Hill, is a 22 y.o.  [place] male with a past psychiatric history significant for Psychosis, who presents at this time with complaints of (and/or evidence of) the following emotional symptoms: agitation, delusions, paranoid behavior, anxiety and psychosis.  Additional symptomatology include anxiety, fearfulness, poor concentration and problem with medication.  The above symptoms have been present for a week. These symptoms are of high severity. These symptoms are constant and intermittent/ fleeting in nature.  The patient's condition has been precipitated by multiple psychosocial stressors .  Patient's condition made worse by continued illicit drug use as well as treatment noncompliance. UDS: +MJ, Amphetamines; BAL=0.   Pt is a [place] and just returned from [place]. He presents very distractible. He has been on various anti-psychotics in the past and refuses to start one on. Reluctantly agreed to a trial of Seroquel.  7/7-Appears less anxious today but is very doubtful about taking Psych meds. Still has delusional thoughts. Slept 4 hrs. Prn used Atarax. Mom visited yesterday.  7/8- patient has been visible, continues to be grossly disorganized. Patient with ongoing delusions per nursing; tolerating medication thus far. Denies SI/HI/AVH on interview. Endorses MJ use.  7/9- no acute overnight  Events. Patient isolative, odd. He states that Seroquel is too strong. Slept  8.75 hours. Patient denies AVH but is noted to be internally preoccupied. Discharge focused on interview. Patient amenable to starting XR formulation of Seroquel for ongoing symptom control though he is wary of the higher dose.  7/10- no acute overnight events, patient visited by family, had a verbal altercation with brother who left afterwards per nursing. Patient medication compliant, complains of sedation from Seroquel but otherwise is more coherent. Discharge focused, no complaints. Still disorganized and grossly internally preoccupied.     ALLERGIES:   Allergies   Allergen Reactions   ??? Aspirin Swelling      MEDICATIONS PRIOR TO ADMISSION:   No medications prior to admission.      PAST MEDICAL HISTORY:   No past medical history on file.No past surgical history on file.   SOCIAL HISTORY: Per SW note   Social History     Socioeconomic History   ??? Marital status: SINGLE     Spouse name: Not on file   ??? Number of children: Not on file   ??? Years of education: Not on file   ??? Highest education level: Not on file   Occupational History   ??? Not on file   Social Needs   ??? Financial resource strain: Not on file   ??? Food insecurity:     Worry: Not on file     Inability: Not on file   ??? Transportation needs:  Medical: Not on file     Non-medical: Not on file   Tobacco Use   ??? Smoking status: Not on file   Substance and Sexual Activity   ??? Alcohol use: Not on file   ??? Drug use: Not on file   ??? Sexual activity: Not on file   Lifestyle   ??? Physical activity:     Days per week: Not on file     Minutes per session: Not on file   ??? Stress: Not on file   Relationships   ??? Social connections:     Talks on phone: Not on file     Gets together: Not on file     Attends religious service: Not on file     Active member of club or organization: Not on file     Attends meetings of clubs or organizations: Not on file     Relationship status: Not on file   ??? Intimate partner violence:     Fear of current or ex partner: Not on file      Emotionally abused: Not on file     Physically abused: Not on file     Forced sexual activity: Not on file   Other Topics Concern   ??? Not on file   Social History Narrative   ??? Not on file      FAMILY HISTORY: History reviewed. No pertinent family history.   No family history on file.    REVIEW OF SYSTEMS:   Psychological ROS: positive for - anxiety, concentration difficulties and sleep disturbances  negative for - disorientation, hallucinations or hostility  Pertinent items are noted in the History of Present Illness.  All other Systems reviewed and are considered negative.           MENTAL STATUS EXAM & VITALS     MENTAL STATUS EXAM (MSE):    MSE FINDINGS ARE WITHIN NORMAL LIMITS (WNL) UNLESS OTHERWISE STATED BELOW. ( ALL OF THE BELOW CATEGORIES OF THE MSE HAVE BEEN REVIEWED (reviewed 06/30/2018) AND UPDATED AS DEEMED APPROPRIATE )  General Presentation age appropriate and casually dressed, cooperative   Orientation oriented to time, place and person   Vital Signs  See below (reviewed 06/30/2018); Vital Signs (BP, Pulse, & Temp) are within normal limits if not listed below.   Gait and Station Stable/steady, no ataxia   Musculoskeletal System No extrapyramidal symptoms (EPS); no abnormal muscular movements or Tardive Dyskinesia (TD); muscle strength and tone are within normal limits   Language No aphasia or dysarthria   Speech:  hypoverbal   Thought Processes coherent; normal rate of thoughts; poor abstract reasoning/computation   Thought Associations circumstantial   Thought Content internally preoccupied   Suicidal Ideations none   Homicidal Ideations none   Mood:  anxious    Affect:  sad   Memory recent  good   Memory remote:  good   Concentration/Attention:  distractable   Fund of Knowledge average   Insight:  limited   Reliability poor   Judgment:  limited          VITALS:     No data found.  Wt Readings from Last 3 Encounters:   06/26/18 63.5 kg (140 lb)     Temp Readings from Last 3 Encounters:   06/29/18 97.9  ??F (36.6 ??C)     BP Readings from Last 3 Encounters:   06/29/18 (!) 79/46     Pulse Readings from Last 3 Encounters:   06/29/18 77  DATA     LABORATORY DATA:  Labs Reviewed   TSH 3RD GENERATION - Abnormal; Notable for the following components:       Result Value    TSH 6.43 (*)     All other components within normal limits   LIPID PANEL - Abnormal; Notable for the following components:    Cholesterol, total 264 (*)     LDL, calculated 183.2 (*)     All other components within normal limits   GLUCOSE, FASTING     Admission on 06/26/2018   Component Date Value Ref Range Status   ??? Glucose 06/27/2018 98  65 - 100 MG/DL Final   ??? TSH 16/09/9603 6.43* 0.36 - 3.74 uIU/mL Final   ??? LIPID PROFILE 06/27/2018        Final   ??? Cholesterol, total 06/27/2018 264* <200 MG/DL Final   ??? Triglyceride 06/27/2018 104  <150 MG/DL Final   ??? HDL Cholesterol 06/27/2018 60  MG/DL Final   ??? LDL, calculated 06/27/2018 183.2* 0 - 100 MG/DL Final   ??? VLDL, calculated 06/27/2018 20.8  MG/DL Final   ??? CHOL/HDL Ratio 06/27/2018 4.4  0.0 - 5.0   Final        RADIOLOGY REPORTS:  No results found for this or any previous visit.No results found.           MEDICATIONS       ALL MEDICATIONS  Current Facility-Administered Medications   Medication Dose Route Frequency   ??? QUEtiapine SR (SEROquel XR) tablet 300 mg  300 mg Oral QHS   ??? ziprasidone (GEODON) 20 mg in sterile water (preservative free) 1 mL injection  20 mg IntraMUSCular BID PRN   ??? OLANZapine (ZyPREXA) tablet 5 mg  5 mg Oral Q6H PRN   ??? benztropine (COGENTIN) tablet 2 mg  2 mg Oral BID PRN   ??? benztropine (COGENTIN) injection 2 mg  2 mg IntraMUSCular BID PRN   ??? LORazepam (ATIVAN) injection 2 mg  2 mg IntraMUSCular Q4H PRN   ??? acetaminophen (TYLENOL) tablet 650 mg  650 mg Oral Q4H PRN   ??? magnesium hydroxide (MILK OF MAGNESIA) 400 mg/5 mL oral suspension 30 mL  30 mL Oral DAILY PRN   ??? nicotine (NICODERM CQ) 21 mg/24 hr patch 1 Patch  1 Patch TransDERmal DAILY PRN   ??? hydrOXYzine  HCl (ATARAX) tablet 50 mg  50 mg Oral Q6H PRN   ??? traZODone (DESYREL) tablet 50 mg  50 mg Oral QHS PRN      SCHEDULED MEDICATIONS  Current Facility-Administered Medications   Medication Dose Route Frequency   ??? QUEtiapine SR (SEROquel XR) tablet 300 mg  300 mg Oral QHS                ASSESSMENT & PLAN        The patient, Kenneth Hill, is a 22 y.o.  male who presents at this time for treatment of the following diagnoses:  Patient Active Hospital Problem List:   Schizoaffective disorder (HCC) (06/26/2018)    Assessment: Presents distractible, denies current AVH, paranoid delusions reported at the time of ER eval.    Plan:   - CONTINUE Seroquel XR 300 mg QHS for psychosis  - Discharge planning         A coordinated, multidisplinary treatment team (includes the nurse, unit pharmcist, Administrator) round was conducted for this initial evaluation with the patient present.     The following regarding medications was addressed  during rounds with patient:   the risks and benefits of the proposed medication. The patient was given the opportunity to ask questions. Informed consent given to the use of the above medications.     I will continue to adjust psychiatric and non-psychiatric medications (see above "medication" section and orders section for details) as deemed appropriate & based upon diagnoses and response to treatment.     I have reviewed admission (and previous/old) labs and medical tests in the EHR and or transferring hospital documents. I will continue to order blood tests/labs and diagnostic tests as deemed appropriate and review results as they become available (see orders for details).    I have reviewed old psychiatric and medical records available in the EHR. I Will order additional psychiatric records from other institutions to further elucidate the nature of patient's psychopathology and review once available.    I will gather additional collateral information from friends, family and o/p  treatment team to further elucidate the nature of patient's psychopathology and baselline level of psychiatric functioning.      ESTIMATED LENGTH OF STAY:  07/02/2018       STRENGTHS:  Access to housing/residential stability, Interpersonal/supportive relationships (family, friends, peers) and Awareness of Substance abuse issues                                        SIGNED:    Hiram Gashsitadinma L Gearldean Lomanto, MD  06/30/2018

## 2018-06-30 NOTE — Behavioral Health Treatment Team (Deleted)
Pt did not attend coping skills group.

## 2018-06-30 NOTE — Behavioral Health Treatment Team (Signed)
Patient slept 8. 25 hours

## 2018-06-30 NOTE — Behavioral Health Treatment Team (Unsigned)
GROUP THERAPY PROGRESS NOTE    The patient Kenneth Hill a 22 y.o. male is participating in Creative Expression Group.     Group time: 1 hour    Personal goal for participation: To concentrate on selected task    Goal orientation: social    Group therapy participation: minimal    Therapeutic interventions reviewed and discussed: Crafts, games, music    Impression of participation: The patient was present-left early    Audelia Hives  06/30/2018  6:36 PM

## 2018-06-30 NOTE — Behavioral Health Treatment Team (Signed)
Patient isolative to his room this evening. He denies any current feelings of SI or HI. Writer provided verbal education to patient about Seroquel. We discuss what the medication does and how a higher dose may cause him to feel drowsy, but will help control the symptoms. He agreed to accept the increased dose of Seroquel. He had a visit with family members this evening. The visit appeared to get intense as patient and male visitor began to raise their voices. Staff defused the situation and he was able to continue his visitation. Pt declined to talk about his visit when it was over. Q 15 minute safety checks continue.

## 2018-07-01 MED ORDER — QUETIAPINE SR 300 MG 24 HR TAB
300 mg | ORAL_TABLET | Freq: Every evening | ORAL | 1 refills | Status: AC
Start: 2018-07-01 — End: ?

## 2018-07-01 MED FILL — SEROQUEL XR 300 MG TABLET,EXTENDED RELEASE: 300 mg | ORAL | Qty: 1

## 2018-07-01 NOTE — Behavioral Health Treatment Team (Cosign Needed)
GROUP THERAPY PROGRESS NOTE    The patient Kenneth Hill a 22 y.o. male is participating in Coping Skills Group.     Group time: 45 minutes    Personal goal for participation: To identify positive coping strategies a-z    Goal orientation:  personal    Group therapy participation: active    Therapeutic interventions reviewed and discussed: worksheet    Impression of participation:  The patient was attentive    [name]  07/01/2018  5:42 PM

## 2018-07-01 NOTE — Behavioral Health Treatment Team (Signed)
PSYCHIATRIC PROGRESS NOTE          IDENTIFICATION:    Patient Name  Kenneth Hill   Date of Birth [birth year]   CSN [id number]   Medical Record Number  [id number]      Age  22 y.o.   PCP None   Admit date:  06/26/2018    Room Number  312/01  @ [facility]   Date of Service  07/01/2018            HISTORY         REASON FOR HOSPITALIZATION:  CC:  Pt admitted under a temporary detention order (TDO) with severe psychosis and proving to be an imminent danger to self and others.    HISTORY OF PRESENT ILLNESS:    The patient, Kenneth Hill, is a 22 y.o.  [place] male with a past psychiatric history significant for Psychosis, who presents at this time with complaints of (and/or evidence of) the following emotional symptoms: agitation, delusions, paranoid behavior, anxiety and psychosis.  Additional symptomatology include anxiety, fearfulness, poor concentration and problem with medication.  The above symptoms have been present for a week. These symptoms are of high severity. These symptoms are constant and intermittent/ fleeting in nature.  The patient's condition has been precipitated by multiple psychosocial stressors .  Patient's condition made worse by continued illicit drug use as well as treatment noncompliance. UDS: +MJ, Amphetamines; BAL=0.   Pt is a [place] and just returned from [place]. He presents very distractible. He has been on various anti-psychotics in the past and refuses to start one on. Reluctantly agreed to a trial of Seroquel.  7/7-Appears less anxious today but is very doubtful about taking Psych meds. Still has delusional thoughts. Slept 4 hrs. Prn used Atarax. Mom visited yesterday.  7/8- patient has been visible, continues to be grossly disorganized. Patient with ongoing delusions per nursing; tolerating medication thus far. Denies SI/HI/AVH on interview. Endorses MJ use.  7/9- no acute overnight  Events. Patient isolative, odd. He states that  Seroquel is too strong. Slept 8.75 hours. Patient denies AVH but is noted to be internally preoccupied. Discharge focused on interview. Patient amenable to starting XR formulation of Seroquel for ongoing symptom control though he is wary of the higher dose.  7/10- no acute overnight events, patient visited by family, had a verbal altercation with brother who left afterwards per nursing. Patient medication compliant, complains of sedation from Seroquel but otherwise is more coherent. Discharge focused, no complaints. Still disorganized and grossly internally preoccupied.  7/11- patient muted, blunted. Out for meals and isolative to room otherwise. Patient discharge focused, medication compliant still c/o sedation. Slept 8.25 hours.      ALLERGIES:   Allergies   Allergen Reactions   ??? Aspirin Swelling      MEDICATIONS PRIOR TO ADMISSION:   No medications prior to admission.      PAST MEDICAL HISTORY:   No past medical history on file.No past surgical history on file.   SOCIAL HISTORY: Per SW note   Social History     Socioeconomic History   ??? Marital status: SINGLE     Spouse name: Not on file   ??? Number of children: Not on file   ??? Years of education: Not on file   ??? Highest education level: Not on file   Occupational History   ??? Not on file   Social Needs   ??? Financial resource strain: Not on file   ???  Food insecurity:     Worry: Not on file     Inability: Not on file   ??? Transportation needs:     Medical: Not on file     Non-medical: Not on file   Tobacco Use   ??? Smoking status: Not on file   Substance and Sexual Activity   ??? Alcohol use: Not on file   ??? Drug use: Not on file   ??? Sexual activity: Not on file   Lifestyle   ??? Physical activity:     Days per week: Not on file     Minutes per session: Not on file   ??? Stress: Not on file   Relationships   ??? Social connections:     Talks on phone: Not on file     Gets together: Not on file     Attends religious service: Not on file      Active member of club or organization: Not on file     Attends meetings of clubs or organizations: Not on file     Relationship status: Not on file   ??? Intimate partner violence:     Fear of current or ex partner: Not on file     Emotionally abused: Not on file     Physically abused: Not on file     Forced sexual activity: Not on file   Other Topics Concern   ??? Not on file   Social History Narrative   ??? Not on file      FAMILY HISTORY: History reviewed. No pertinent family history.   No family history on file.    REVIEW OF SYSTEMS:   Psychological ROS: positive for - anxiety, concentration difficulties and sleep disturbances  negative for - disorientation, hallucinations or hostility  Pertinent items are noted in the History of Present Illness.  All other Systems reviewed and are considered negative.           MENTAL STATUS EXAM & VITALS     MENTAL STATUS EXAM (MSE):    MSE FINDINGS ARE WITHIN NORMAL LIMITS (WNL) UNLESS OTHERWISE STATED BELOW. ( ALL OF THE BELOW CATEGORIES OF THE MSE HAVE BEEN REVIEWED (reviewed 07/01/2018) AND UPDATED AS DEEMED APPROPRIATE )  General Presentation age appropriate and casually dressed, cooperative   Orientation oriented to time, place and person   Vital Signs  See below (reviewed 07/01/2018); Vital Signs (BP, Pulse, & Temp) are within normal limits if not listed below.   Gait and Station Stable/steady, no ataxia   Musculoskeletal System No extrapyramidal symptoms (EPS); no abnormal muscular movements or Tardive Dyskinesia (TD); muscle strength and tone are within normal limits   Language No aphasia or dysarthria   Speech:  hypoverbal   Thought Processes coherent; normal rate of thoughts; poor abstract reasoning/computation   Thought Associations circumstantial   Thought Content internally preoccupied   Suicidal Ideations none   Homicidal Ideations none   Mood:  anxious    Affect:  sad   Memory recent  good   Memory remote:  good   Concentration/Attention:  distractable    Fund of Knowledge average   Insight:  limited   Reliability poor   Judgment:  limited          VITALS:     Patient Vitals for the past 24 hrs:   Temp Pulse Resp BP SpO2   07/01/18 0752 97.8 ??F (36.6 ??C) 72 16 114/68 100 %   06/30/18 2009 98.3 ??F (36.8 ??C) 77 16  121/66 98 %     Wt Readings from Last 3 Encounters:   06/26/18 63.5 kg (140 lb)     Temp Readings from Last 3 Encounters:   07/01/18 97.8 ??F (36.6 ??C)     BP Readings from Last 3 Encounters:   07/01/18 114/68     Pulse Readings from Last 3 Encounters:   07/01/18 72            DATA     LABORATORY DATA:  Labs Reviewed   TSH 3RD GENERATION - Abnormal; Notable for the following components:       Result Value    TSH 6.43 (*)     All other components within normal limits   LIPID PANEL - Abnormal; Notable for the following components:    Cholesterol, total 264 (*)     LDL, calculated 183.2 (*)     All other components within normal limits   GLUCOSE, FASTING     Admission on 06/26/2018   Component Date Value Ref Range Status   ??? Glucose 06/27/2018 98  65 - 100 MG/DL Final   ??? TSH 16/09/9603 6.43* 0.36 - 3.74 uIU/mL Final   ??? LIPID PROFILE 06/27/2018        Final   ??? Cholesterol, total 06/27/2018 264* <200 MG/DL Final   ??? Triglyceride 06/27/2018 104  <150 MG/DL Final   ??? HDL Cholesterol 06/27/2018 60  MG/DL Final   ??? LDL, calculated 06/27/2018 183.2* 0 - 100 MG/DL Final   ??? VLDL, calculated 06/27/2018 20.8  MG/DL Final   ??? CHOL/HDL Ratio 06/27/2018 4.4  0.0 - 5.0   Final        RADIOLOGY REPORTS:  No results found for this or any previous visit.No results found.           MEDICATIONS       ALL MEDICATIONS  Current Facility-Administered Medications   Medication Dose Route Frequency   ??? QUEtiapine SR (SEROquel XR) tablet 300 mg  300 mg Oral QHS   ??? ziprasidone (GEODON) 20 mg in sterile water (preservative free) 1 mL injection  20 mg IntraMUSCular BID PRN   ??? OLANZapine (ZyPREXA) tablet 5 mg  5 mg Oral Q6H PRN   ??? benztropine (COGENTIN) tablet 2 mg  2 mg Oral BID PRN    ??? benztropine (COGENTIN) injection 2 mg  2 mg IntraMUSCular BID PRN   ??? LORazepam (ATIVAN) injection 2 mg  2 mg IntraMUSCular Q4H PRN   ??? acetaminophen (TYLENOL) tablet 650 mg  650 mg Oral Q4H PRN   ??? magnesium hydroxide (MILK OF MAGNESIA) 400 mg/5 mL oral suspension 30 mL  30 mL Oral DAILY PRN   ??? nicotine (NICODERM CQ) 21 mg/24 hr patch 1 Patch  1 Patch TransDERmal DAILY PRN   ??? hydrOXYzine HCl (ATARAX) tablet 50 mg  50 mg Oral Q6H PRN   ??? traZODone (DESYREL) tablet 50 mg  50 mg Oral QHS PRN      SCHEDULED MEDICATIONS  Current Facility-Administered Medications   Medication Dose Route Frequency   ??? QUEtiapine SR (SEROquel XR) tablet 300 mg  300 mg Oral QHS              ASSESSMENT & PLAN        The patient, Kenneth Hill, is a 22 y.o.  male who presents at this time for treatment of the following diagnoses:  Patient Active Hospital Problem List:   Schizoaffective disorder (HCC) (06/26/2018)    Assessment: Presents distractible,  denies current AVH, paranoid delusions reported at the time of ER eval.    Plan:   - CONTINUE Seroquel XR 300 mg QHS for psychosis  - Discharge planning         A coordinated, multidisplinary treatment team (includes the nurse, unit pharmcist, Administrator) round was conducted for this initial evaluation with the patient present.     The following regarding medications was addressed during rounds with patient:   the risks and benefits of the proposed medication. The patient was given the opportunity to ask questions. Informed consent given to the use of the above medications.     I will continue to adjust psychiatric and non-psychiatric medications (see above "medication" section and orders section for details) as deemed appropriate & based upon diagnoses and response to treatment.     I have reviewed admission (and previous/old) labs and medical tests in the EHR and or transferring hospital documents. I will continue to order blood  tests/labs and diagnostic tests as deemed appropriate and review results as they become available (see orders for details).    I have reviewed old psychiatric and medical records available in the EHR. I Will order additional psychiatric records from other institutions to further elucidate the nature of patient's psychopathology and review once available.    I will gather additional collateral information from friends, family and o/p treatment team to further elucidate the nature of patient's psychopathology and baselline level of psychiatric functioning.      ESTIMATED LENGTH OF STAY:  07/05/2018       STRENGTHS:  Access to housing/residential stability, Interpersonal/supportive relationships (family, friends, peers) and Awareness of Substance abuse issues                                        SIGNED:    Hiram Gash, MD  07/01/2018

## 2018-07-01 NOTE — Behavioral Health Treatment Team (Cosign Needed)
GROUP THERAPY PROGRESS NOTE    The patient Kenneth Hill a 22 y.o. male is participating in Creative Expression Group.     Group time: 1 hour    Personal goal for participation: To concentrate on selected task    Goal orientation: social    Group therapy participation: active    Therapeutic interventions reviewed and discussed: Crafts, games, music    Impression of participation: The patient was attentive.    [name]  07/01/2018  6:38 PM

## 2018-07-01 NOTE — Behavioral Health Treatment Team (Signed)
PSYCHIATRIC PROGRESS NOTE            IDENTIFICATION:    Patient Name  Kenneth Hill   Date of Birth [birth year]   CSN [id number]   Medical Record Number  [id number]      Age  22 y.o.   PCP None   Admit date:  06/26/2018    Room Number  312/01  @ [facility]   Date of Service  07/01/2018            HISTORY         REASON FOR HOSPITALIZATION:  CC:  Pt admitted under a temporary detention order (TDO) with severe psychosis and proving to be an imminent danger to self and others.    HISTORY OF PRESENT ILLNESS:    The patient, Kenneth Hill, is a 22 y.o.  [place] male with a past psychiatric history significant for Psychosis, who presents at this time with complaints of (and/or evidence of) the following emotional symptoms: agitation, delusions, paranoid behavior, anxiety and psychosis.  Additional symptomatology include anxiety, fearfulness, poor concentration and problem with medication.  The above symptoms have been present for a week. These symptoms are of high severity. These symptoms are constant and intermittent/ fleeting in nature.  The patient's condition has been precipitated by multiple psychosocial stressors .  Patient's condition made worse by continued illicit drug use as well as treatment noncompliance. UDS: +MJ, Amphetamines; BAL=0.   Pt is a [place] and just returned from [place]. He presents very distractible. He has been on various anti-psychotics in the past and refuses to start one on. Reluctantly agreed to a trial of Seroquel.  7/7-Appears less anxious today but is very doubtful about taking Psych meds. Still has delusional thoughts. Slept 4 hrs. Prn used Atarax. Mom visited yesterday.  7/8- patient has been visible, continues to be grossly disorganized. Patient with ongoing delusions per nursing; tolerating medication thus far. Denies SI/HI/AVH on interview. Endorses MJ use.  7/9- no acute overnight  Events. Patient isolative, odd. He states that Seroquel is too strong. Slept  8.75 hours. Patient denies AVH but is noted to be internally preoccupied. Discharge focused on interview. Patient amenable to starting XR formulation of Seroquel for ongoing symptom control though he is wary of the higher dose.  7/10- no acute overnight events, patient visited by family, had a verbal altercation with brother who left afterwards per nursing. Patient medication compliant, complains of sedation from Seroquel but otherwise is more coherent. Discharge focused, no complaints. Still disorganized and grossly internally preoccupied.  7/11- patient muted, blunted. Out for meals and isolative to room otherwise. Patient discharge focused, medication compliant still c/o sedation. Slept 8.25 hours.      ALLERGIES:   Allergies   Allergen Reactions   ??? Aspirin Swelling      MEDICATIONS PRIOR TO ADMISSION:   No medications prior to admission.      PAST MEDICAL HISTORY:   No past medical history on file.No past surgical history on file.   SOCIAL HISTORY: Per SW note   Social History     Socioeconomic History   ??? Marital status: SINGLE     Spouse name: Not on file   ??? Number of children: Not on file   ??? Years of education: Not on file   ??? Highest education level: Not on file   Occupational History   ??? Not on file   Social Needs   ??? Financial resource strain: Not on file   ???  Food insecurity:     Worry: Not on file     Inability: Not on file   ??? Transportation needs:     Medical: Not on file     Non-medical: Not on file   Tobacco Use   ??? Smoking status: Not on file   Substance and Sexual Activity   ??? Alcohol use: Not on file   ??? Drug use: Not on file   ??? Sexual activity: Not on file   Lifestyle   ??? Physical activity:     Days per week: Not on file     Minutes per session: Not on file   ??? Stress: Not on file   Relationships   ??? Social connections:     Talks on phone: Not on file     Gets together: Not on file     Attends religious service: Not on file     Active member of club or organization: Not on file     Attends  meetings of clubs or organizations: Not on file     Relationship status: Not on file   ??? Intimate partner violence:     Fear of current or ex partner: Not on file     Emotionally abused: Not on file     Physically abused: Not on file     Forced sexual activity: Not on file   Other Topics Concern   ??? Not on file   Social History Narrative   ??? Not on file      FAMILY HISTORY: History reviewed. No pertinent family history.   No family history on file.    REVIEW OF SYSTEMS:   Psychological ROS: positive for - anxiety, concentration difficulties and sleep disturbances  negative for - disorientation, hallucinations or hostility  Pertinent items are noted in the History of Present Illness.  All other Systems reviewed and are considered negative.           MENTAL STATUS EXAM & VITALS     MENTAL STATUS EXAM (MSE):    MSE FINDINGS ARE WITHIN NORMAL LIMITS (WNL) UNLESS OTHERWISE STATED BELOW. ( ALL OF THE BELOW CATEGORIES OF THE MSE HAVE BEEN REVIEWED (reviewed 07/01/2018) AND UPDATED AS DEEMED APPROPRIATE )  General Presentation age appropriate and casually dressed, cooperative   Orientation oriented to time, place and person   Vital Signs  See below (reviewed 07/01/2018); Vital Signs (BP, Pulse, & Temp) are within normal limits if not listed below.   Gait and Station Stable/steady, no ataxia   Musculoskeletal System No extrapyramidal symptoms (EPS); no abnormal muscular movements or Tardive Dyskinesia (TD); muscle strength and tone are within normal limits   Language No aphasia or dysarthria   Speech:  hypoverbal   Thought Processes coherent; normal rate of thoughts; poor abstract reasoning/computation   Thought Associations circumstantial   Thought Content internally preoccupied   Suicidal Ideations none   Homicidal Ideations none   Mood:  anxious    Affect:  sad   Memory recent  good   Memory remote:  good   Concentration/Attention:  distractable   Fund of Knowledge average   Insight:  limited   Reliability poor   Judgment:   limited          VITALS:     Patient Vitals for the past 24 hrs:   Temp Pulse Resp BP SpO2   07/01/18 0752 97.8 ??F (36.6 ??C) 72 16 114/68 100 %   06/30/18 2009 98.3 ??F (36.8 ??C) 77 16  121/66 98 %     Wt Readings from Last 3 Encounters:   06/26/18 63.5 kg (140 lb)     Temp Readings from Last 3 Encounters:   07/01/18 97.8 ??F (36.6 ??C)     BP Readings from Last 3 Encounters:   07/01/18 114/68     Pulse Readings from Last 3 Encounters:   07/01/18 72            DATA     LABORATORY DATA:  Labs Reviewed   TSH 3RD GENERATION - Abnormal; Notable for the following components:       Result Value    TSH 6.43 (*)     All other components within normal limits   LIPID PANEL - Abnormal; Notable for the following components:    Cholesterol, total 264 (*)     LDL, calculated 183.2 (*)     All other components within normal limits   GLUCOSE, FASTING     Admission on 06/26/2018   Component Date Value Ref Range Status   ??? Glucose 06/27/2018 98  65 - 100 MG/DL Final   ??? TSH 78/29/5621 6.43* 0.36 - 3.74 uIU/mL Final   ??? LIPID PROFILE 06/27/2018        Final   ??? Cholesterol, total 06/27/2018 264* <200 MG/DL Final   ??? Triglyceride 06/27/2018 104  <150 MG/DL Final   ??? HDL Cholesterol 06/27/2018 60  MG/DL Final   ??? LDL, calculated 06/27/2018 183.2* 0 - 100 MG/DL Final   ??? VLDL, calculated 06/27/2018 20.8  MG/DL Final   ??? CHOL/HDL Ratio 06/27/2018 4.4  0.0 - 5.0   Final        RADIOLOGY REPORTS:  No results found for this or any previous visit.No results found.           MEDICATIONS       ALL MEDICATIONS  Current Facility-Administered Medications   Medication Dose Route Frequency   ??? QUEtiapine SR (SEROquel XR) tablet 300 mg  300 mg Oral QHS   ??? ziprasidone (GEODON) 20 mg in sterile water (preservative free) 1 mL injection  20 mg IntraMUSCular BID PRN   ??? OLANZapine (ZyPREXA) tablet 5 mg  5 mg Oral Q6H PRN   ??? benztropine (COGENTIN) tablet 2 mg  2 mg Oral BID PRN   ??? benztropine (COGENTIN) injection 2 mg  2 mg IntraMUSCular BID PRN   ???  LORazepam (ATIVAN) injection 2 mg  2 mg IntraMUSCular Q4H PRN   ??? acetaminophen (TYLENOL) tablet 650 mg  650 mg Oral Q4H PRN   ??? magnesium hydroxide (MILK OF MAGNESIA) 400 mg/5 mL oral suspension 30 mL  30 mL Oral DAILY PRN   ??? nicotine (NICODERM CQ) 21 mg/24 hr patch 1 Patch  1 Patch TransDERmal DAILY PRN   ??? hydrOXYzine HCl (ATARAX) tablet 50 mg  50 mg Oral Q6H PRN   ??? traZODone (DESYREL) tablet 50 mg  50 mg Oral QHS PRN      SCHEDULED MEDICATIONS  Current Facility-Administered Medications   Medication Dose Route Frequency   ??? QUEtiapine SR (SEROquel XR) tablet 300 mg  300 mg Oral QHS                ASSESSMENT & PLAN        The patient, Kenneth Hill, is a 22 y.o.  male who presents at this time for treatment of the following diagnoses:  Patient Active Hospital Problem List:   Schizoaffective disorder (HCC) (06/26/2018)    Assessment:  Presents distractible, denies current AVH, paranoid delusions reported at the time of ER eval.    Plan:   - CONTINUE Seroquel XR 300 mg QHS for psychosis  - Discharge planning         A coordinated, multidisplinary treatment team (includes the nurse, unit pharmcist, Administrator) round was conducted for this initial evaluation with the patient present.     The following regarding medications was addressed during rounds with patient:   the risks and benefits of the proposed medication. The patient was given the opportunity to ask questions. Informed consent given to the use of the above medications.     I will continue to adjust psychiatric and non-psychiatric medications (see above "medication" section and orders section for details) as deemed appropriate & based upon diagnoses and response to treatment.     I have reviewed admission (and previous/old) labs and medical tests in the EHR and or transferring hospital documents. I will continue to order blood tests/labs and diagnostic tests as deemed appropriate and review results as they become available (see orders for  details).    I have reviewed old psychiatric and medical records available in the EHR. I Will order additional psychiatric records from other institutions to further elucidate the nature of patient's psychopathology and review once available.    I will gather additional collateral information from friends, family and o/p treatment team to further elucidate the nature of patient's psychopathology and baselline level of psychiatric functioning.      ESTIMATED LENGTH OF STAY:  07/05/2018       STRENGTHS:  Access to housing/residential stability, Interpersonal/supportive relationships (family, friends, peers) and Awareness of Substance abuse issues                                        SIGNED:    Hiram Gash, MD  07/01/2018

## 2018-07-01 NOTE — Behavioral Health Treatment Team (Deleted)
GROUP THERAPY PROGRESS NOTE    The patient Kenneth Hill a 22 y.o. male is participating in Creative Expression Group.     Group time: 1 hour    Personal goal for participation: To concentrate on selected task    Goal orientation: social    Group therapy participation: active    Therapeutic interventions reviewed and discussed: Crafts, games, music    Impression of participation: The patient was attentive.    Audelia HivesBeverly S Baker  07/01/2018  6:38 PM

## 2018-07-01 NOTE — Behavioral Health Treatment Team (Deleted)
GROUP THERAPY PROGRESS NOTE    The patient Kenneth Hill a 22 y.o. male is participating in Coping Skills Group.     Group time: 45 minutes    Personal goal for participation: To identify positive coping strategies a-z    Goal orientation:  personal    Group therapy participation: active    Therapeutic interventions reviewed and discussed: worksheet    Impression of participation:  The patient was attentive    Audelia HivesBeverly S Baker  07/01/2018  5:42 PM

## 2018-07-02 MED FILL — SEROQUEL XR 300 MG TABLET,EXTENDED RELEASE: 300 mg | ORAL | Qty: 1

## 2018-07-02 NOTE — Behavioral Health Treatment Team (Signed)
PSYCHIATRIC PROGRESS NOTE          IDENTIFICATION:    Patient Name  Kenneth Hill   Date of Birth [birth year]   CSN [id number]   Medical Record Number  [id number]      Age  22 y.o.   PCP None   Admit date:  06/26/2018    Room Number  312/01  @ [facility]   Date of Service  07/02/2018            HISTORY         REASON FOR HOSPITALIZATION:  CC:  Pt admitted under a temporary detention order (TDO) with severe psychosis and proving to be an imminent danger to self and others.    HISTORY OF PRESENT ILLNESS:    The patient, Kenneth Hill, is a 22 y.o.  [place] male with a past psychiatric history significant for Psychosis, who presents at this time with complaints of (and/or evidence of) the following emotional symptoms: agitation, delusions, paranoid behavior, anxiety and psychosis.  Additional symptomatology include anxiety, fearfulness, poor concentration and problem with medication.  The above symptoms have been present for a week. These symptoms are of high severity. These symptoms are constant and intermittent/ fleeting in nature.  The patient's condition has been precipitated by multiple psychosocial stressors .  Patient's condition made worse by continued illicit drug use as well as treatment noncompliance. UDS: +MJ, Amphetamines; BAL=0.   Pt is [place] and just returned from [place]. He presents very distractible. He has been on various anti-psychotics in the past and refuses to start one on. Reluctantly agreed to a trial of Seroquel.  7/7-Appears less anxious today but is very doubtful about taking Psych meds. Still has delusional thoughts. Slept 4 hrs. Prn used Atarax. Mom visited yesterday.  7/8- patient has been visible, continues to be grossly disorganized. Patient with ongoing delusions per nursing; tolerating medication thus far. Denies SI/HI/AVH on interview. Endorses MJ use.  7/9- no acute overnight  Events. Patient isolative, odd. He states that  Seroquel is too strong. Slept 8.75 hours. Patient denies AVH but is noted to be internally preoccupied. Discharge focused on interview. Patient amenable to starting XR formulation of Seroquel for ongoing symptom control though he is wary of the higher dose.  7/10- no acute overnight events, patient visited by family, had a verbal altercation with brother who left afterwards per nursing. Patient medication compliant, complains of sedation from Seroquel but otherwise is more coherent. Discharge focused, no complaints. Still disorganized and grossly internally preoccupied.  7/11- patient muted, blunted. Out for meals and isolative to room otherwise. Patient discharge focused, medication compliant still c/o sedation. Slept 8.25 hours.   7/12- no acute overnight events. Patient has been visible, no agitation noted. Patient still muted, discharge focused. Family is concerned that he may relapse shortly after discharge, want rehab for him.     ALLERGIES:   Allergies   Allergen Reactions   ??? Aspirin Swelling      MEDICATIONS PRIOR TO ADMISSION:   No medications prior to admission.      PAST MEDICAL HISTORY:   No past medical history on file.No past surgical history on file.   SOCIAL HISTORY: Per SW note   Social History     Socioeconomic History   ??? Marital status: SINGLE     Spouse name: Not on file   ??? Number of children: Not on file   ??? Years of education: Not on file   ???  Highest education level: Not on file   Occupational History   ??? Not on file   Social Needs   ??? Financial resource strain: Not on file   ??? Food insecurity:     Worry: Not on file     Inability: Not on file   ??? Transportation needs:     Medical: Not on file     Non-medical: Not on file   Tobacco Use   ??? Smoking status: Not on file   Substance and Sexual Activity   ??? Alcohol use: Not on file   ??? Drug use: Not on file   ??? Sexual activity: Not on file   Lifestyle   ??? Physical activity:     Days per week: Not on file     Minutes per session: Not on file    ??? Stress: Not on file   Relationships   ??? Social connections:     Talks on phone: Not on file     Gets together: Not on file     Attends religious service: Not on file     Active member of club or organization: Not on file     Attends meetings of clubs or organizations: Not on file     Relationship status: Not on file   ??? Intimate partner violence:     Fear of current or ex partner: Not on file     Emotionally abused: Not on file     Physically abused: Not on file     Forced sexual activity: Not on file   Other Topics Concern   ??? Not on file   Social History Narrative   ??? Not on file      FAMILY HISTORY: History reviewed. No pertinent family history.   No family history on file.    REVIEW OF SYSTEMS:   Psychological ROS: positive for - anxiety, concentration difficulties and sleep disturbances  negative for - disorientation, hallucinations or hostility  Pertinent items are noted in the History of Present Illness.  All other Systems reviewed and are considered negative.           MENTAL STATUS EXAM & VITALS     MENTAL STATUS EXAM (MSE):    MSE FINDINGS ARE WITHIN NORMAL LIMITS (WNL) UNLESS OTHERWISE STATED BELOW. ( ALL OF THE BELOW CATEGORIES OF THE MSE HAVE BEEN REVIEWED (reviewed 07/02/2018) AND UPDATED AS DEEMED APPROPRIATE )  General Presentation age appropriate and casually dressed, cooperative   Orientation oriented to time, place and person   Vital Signs  See below (reviewed 07/02/2018); Vital Signs (BP, Pulse, & Temp) are within normal limits if not listed below.   Gait and Station Stable/steady, no ataxia   Musculoskeletal System No extrapyramidal symptoms (EPS); no abnormal muscular movements or Tardive Dyskinesia (TD); muscle strength and tone are within normal limits   Language No aphasia or dysarthria   Speech:  hypoverbal   Thought Processes coherent; normal rate of thoughts; poor abstract reasoning/computation   Thought Associations goal directed   Thought Content internally preoccupied    Suicidal Ideations none   Homicidal Ideations none   Mood:  anxious    Affect:  sad   Memory recent  good   Memory remote:  good   Concentration/Attention:  distractable   Fund of Knowledge average   Insight:  limited   Reliability poor   Judgment:  limited          VITALS:     Patient Vitals for the  past 24 hrs:   Temp Pulse Resp BP SpO2   07/02/18 0746 97.8 ??F (36.6 ??C) 64 16 115/65 99 %   07/01/18 1938 98.2 ??F (36.8 ??C) 78 16 109/67 100 %     Wt Readings from Last 3 Encounters:   06/26/18 63.5 kg (140 lb)     Temp Readings from Last 3 Encounters:   07/02/18 97.8 ??F (36.6 ??C)     BP Readings from Last 3 Encounters:   07/02/18 115/65     Pulse Readings from Last 3 Encounters:   07/02/18 64            DATA     LABORATORY DATA:  Labs Reviewed   TSH 3RD GENERATION - Abnormal; Notable for the following components:       Result Value    TSH 6.43 (*)     All other components within normal limits   LIPID PANEL - Abnormal; Notable for the following components:    Cholesterol, total 264 (*)     LDL, calculated 183.2 (*)     All other components within normal limits   GLUCOSE, FASTING     Admission on 06/26/2018   Component Date Value Ref Range Status   ??? Glucose 06/27/2018 98  65 - 100 MG/DL Final   ??? TSH 16/09/9603 6.43* 0.36 - 3.74 uIU/mL Final   ??? LIPID PROFILE 06/27/2018        Final   ??? Cholesterol, total 06/27/2018 264* <200 MG/DL Final   ??? Triglyceride 06/27/2018 104  <150 MG/DL Final   ??? HDL Cholesterol 06/27/2018 60  MG/DL Final   ??? LDL, calculated 06/27/2018 183.2* 0 - 100 MG/DL Final   ??? VLDL, calculated 06/27/2018 20.8  MG/DL Final   ??? CHOL/HDL Ratio 06/27/2018 4.4  0.0 - 5.0   Final        RADIOLOGY REPORTS:  No results found for this or any previous visit.No results found.           MEDICATIONS       ALL MEDICATIONS  Current Facility-Administered Medications   Medication Dose Route Frequency   ??? QUEtiapine SR (SEROquel XR) tablet 300 mg  300 mg Oral QHS    ??? ziprasidone (GEODON) 20 mg in sterile water (preservative free) 1 mL injection  20 mg IntraMUSCular BID PRN   ??? OLANZapine (ZyPREXA) tablet 5 mg  5 mg Oral Q6H PRN   ??? benztropine (COGENTIN) tablet 2 mg  2 mg Oral BID PRN   ??? benztropine (COGENTIN) injection 2 mg  2 mg IntraMUSCular BID PRN   ??? LORazepam (ATIVAN) injection 2 mg  2 mg IntraMUSCular Q4H PRN   ??? acetaminophen (TYLENOL) tablet 650 mg  650 mg Oral Q4H PRN   ??? magnesium hydroxide (MILK OF MAGNESIA) 400 mg/5 mL oral suspension 30 mL  30 mL Oral DAILY PRN   ??? nicotine (NICODERM CQ) 21 mg/24 hr patch 1 Patch  1 Patch TransDERmal DAILY PRN   ??? hydrOXYzine HCl (ATARAX) tablet 50 mg  50 mg Oral Q6H PRN   ??? traZODone (DESYREL) tablet 50 mg  50 mg Oral QHS PRN      SCHEDULED MEDICATIONS  Current Facility-Administered Medications   Medication Dose Route Frequency   ??? QUEtiapine SR (SEROquel XR) tablet 300 mg  300 mg Oral QHS              ASSESSMENT & PLAN        The patient, Kenneth Hill, is a  22 y.o.  male who presents at this time for treatment of the following diagnoses:  Patient Active Hospital Problem List:   Schizoaffective disorder (HCC) (06/26/2018)    Assessment: Presents distractible, denies current AVH, paranoid delusions reported at the time of ER eval.    Plan:   - CONTINUE Seroquel XR 300 mg QHS for psychosis  - Discharge planning (rehab vs home)         A coordinated, multidisplinary treatment team (includes the nurse, unit pharmcist, Administratorsocial worker and writer) round was conducted for this initial evaluation with the patient present.     The following regarding medications was addressed during rounds with patient:   the risks and benefits of the proposed medication. The patient was given the opportunity to ask questions. Informed consent given to the use of the above medications.     I will continue to adjust psychiatric and non-psychiatric medications (see above "medication" section and orders section for details) as deemed  appropriate & based upon diagnoses and response to treatment.     I have reviewed admission (and previous/old) labs and medical tests in the EHR and or transferring hospital documents. I will continue to order blood tests/labs and diagnostic tests as deemed appropriate and review results as they become available (see orders for details).    I have reviewed old psychiatric and medical records available in the EHR. I Will order additional psychiatric records from other institutions to further elucidate the nature of patient's psychopathology and review once available.    I will gather additional collateral information from friends, family and o/p treatment team to further elucidate the nature of patient's psychopathology and baselline level of psychiatric functioning.      ESTIMATED LENGTH OF STAY:  07/05/2018       STRENGTHS:  Access to housing/residential stability, Interpersonal/supportive relationships (family, friends, peers) and Awareness of Substance abuse issues                                        SIGNED:    Hiram Gashsitadinma L Vinette Crites, MD  07/02/2018

## 2018-07-02 NOTE — Behavioral Health Treatment Team (Signed)
0600-Patient has slept 8 hours so far and is currently still sleeping. Will continue to monitor per protocol.

## 2018-07-02 NOTE — Behavioral Health Treatment Team (Cosign Needed)
Pt did not attend coping skills group.

## 2018-07-02 NOTE — Progress Notes (Signed)
1900 Received report from Annilee Sykes, RN    2100 Pt isolated to room most of the evening, came out only for snacks and medications. Limited interactions with peers and staff noted.

## 2018-07-02 NOTE — Progress Notes (Addendum)
1900 Assumed care of pt   2030 Pt med compliant. Denies SI/HI/AVH. No complaints voiced. Visible in milieu, interacting minimally but appropriately with peers and staff.  0000 Appears to be sleeping. No S/S of acute distress noted  0550 Pt rested 6 hours overnight. Hourly rounds and Q15 minutes maintained and to continue.

## 2018-07-02 NOTE — Progress Notes (Addendum)
0719 LaWanda Hubbard RN received report from Tiffany Skok RN.    0746 Pt up in room in bed. Pt presents with a cooperative attitude, flat affect, and depressed mood. Pt is preoccupied. Pt denies SI, HI, and AVH. Pt is alert and oriented x 4. Pt isolative to the room throughout most of the morning.    0838 Pt ate 75% of his breakfast. Pt interacting well with staff and peers.     1230 Pt's father called to inquire about pt's condition. This writer accepted the call, then later gave the phone to the pt's attending psychiatrist. Pt's father spoke with the pt's attending psychiatrist regarding the pt's condition.     1500 Pt in dayroom on the phone. Pt calm.

## 2018-07-02 NOTE — Behavioral Health Treatment Team (Cosign Needed)
GROUP THERAPY PROGRESS NOTE    The patient Kenneth Hill a 22 y.o. male is participating in Creative Expression Group.     Group time: 1 hour    Personal goal for participation: To concentrate on selected task    Goal orientation: social    Group therapy participation: active    Therapeutic interventions reviewed and discussed: Crafts, games, music    Impression of participation: The patient was attentive.    [name]  07/02/2018  4:47 PM

## 2018-07-02 NOTE — Behavioral Health Treatment Team (Deleted)
Pt did not attend coping skills group.

## 2018-07-02 NOTE — Behavioral Health Treatment Team (Signed)
PSYCHIATRIC PROGRESS NOTE            IDENTIFICATION:    Patient Name  Kenneth Hill   Date of Birth Aug 06, 1996   CSN [id number]   Medical Record Number  [id number]      Age  22 y.o.   PCP None   Admit date:  06/26/2018    Room Number  312/01  @ [facility]   Date of Service  07/02/2018            HISTORY         REASON FOR HOSPITALIZATION:  CC:  Pt admitted under a temporary detention order (TDO) with severe psychosis and proving to be an imminent danger to self and others.    HISTORY OF PRESENT ILLNESS:    The patient, Kenneth Hill, is a 22 y.o.  [place] male with a past psychiatric history significant for Psychosis, who presents at this time with complaints of (and/or evidence of) the following emotional symptoms: agitation, delusions, paranoid behavior, anxiety and psychosis.  Additional symptomatology include anxiety, fearfulness, poor concentration and problem with medication.  The above symptoms have been present for a week. These symptoms are of high severity. These symptoms are constant and intermittent/ fleeting in nature.  The patient's condition has been precipitated by multiple psychosocial stressors .  Patient's condition made worse by continued illicit drug use as well as treatment noncompliance. UDS: +MJ, Amphetamines; BAL=0.   Pt is a [place] and just returned from [place]. He presents very distractible. He has been on various anti-psychotics in the past and refuses to start one on. Reluctantly agreed to a trial of Seroquel.  7/7-Appears less anxious today but is very doubtful about taking Psych meds. Still has delusional thoughts. Slept 4 hrs. Prn used Atarax. Mom visited yesterday.  7/8- patient has been visible, continues to be grossly disorganized. Patient with ongoing delusions per nursing; tolerating medication thus far. Denies SI/HI/AVH on interview. Endorses MJ use.  7/9- no acute overnight  Events. Patient isolative, odd. He states that Seroquel is too strong. Slept  8.75 hours. Patient denies AVH but is noted to be internally preoccupied. Discharge focused on interview. Patient amenable to starting XR formulation of Seroquel for ongoing symptom control though he is wary of the higher dose.  7/10- no acute overnight events, patient visited by family, had a verbal altercation with brother who left afterwards per nursing. Patient medication compliant, complains of sedation from Seroquel but otherwise is more coherent. Discharge focused, no complaints. Still disorganized and grossly internally preoccupied.  7/11- patient muted, blunted. Out for meals and isolative to room otherwise. Patient discharge focused, medication compliant still c/o sedation. Slept 8.25 hours.   7/12- no acute overnight events. Patient has been visible, no agitation noted. Patient still muted, discharge focused. Family is concerned that he may relapse shortly after discharge, want rehab for him.     ALLERGIES:   Allergies   Allergen Reactions   ??? Aspirin Swelling      MEDICATIONS PRIOR TO ADMISSION:   No medications prior to admission.      PAST MEDICAL HISTORY:   No past medical history on file.No past surgical history on file.   SOCIAL HISTORY: Per SW note   Social History     Socioeconomic History   ??? Marital status: SINGLE     Spouse name: Not on file   ??? Number of children: Not on file   ??? Years of education: Not on file   ???  Highest education level: Not on file   Occupational History   ??? Not on file   Social Needs   ??? Financial resource strain: Not on file   ??? Food insecurity:     Worry: Not on file     Inability: Not on file   ??? Transportation needs:     Medical: Not on file     Non-medical: Not on file   Tobacco Use   ??? Smoking status: Not on file   Substance and Sexual Activity   ??? Alcohol use: Not on file   ??? Drug use: Not on file   ??? Sexual activity: Not on file   Lifestyle   ??? Physical activity:     Days per week: Not on file     Minutes per session: Not on file   ??? Stress: Not on file    Relationships   ??? Social connections:     Talks on phone: Not on file     Gets together: Not on file     Attends religious service: Not on file     Active member of club or organization: Not on file     Attends meetings of clubs or organizations: Not on file     Relationship status: Not on file   ??? Intimate partner violence:     Fear of current or ex partner: Not on file     Emotionally abused: Not on file     Physically abused: Not on file     Forced sexual activity: Not on file   Other Topics Concern   ??? Not on file   Social History Narrative   ??? Not on file      FAMILY HISTORY: History reviewed. No pertinent family history.   No family history on file.    REVIEW OF SYSTEMS:   Psychological ROS: positive for - anxiety, concentration difficulties and sleep disturbances  negative for - disorientation, hallucinations or hostility  Pertinent items are noted in the History of Present Illness.  All other Systems reviewed and are considered negative.           MENTAL STATUS EXAM & VITALS     MENTAL STATUS EXAM (MSE):    MSE FINDINGS ARE WITHIN NORMAL LIMITS (WNL) UNLESS OTHERWISE STATED BELOW. ( ALL OF THE BELOW CATEGORIES OF THE MSE HAVE BEEN REVIEWED (reviewed 07/02/2018) AND UPDATED AS DEEMED APPROPRIATE )  General Presentation age appropriate and casually dressed, cooperative   Orientation oriented to time, place and person   Vital Signs  See below (reviewed 07/02/2018); Vital Signs (BP, Pulse, & Temp) are within normal limits if not listed below.   Gait and Station Stable/steady, no ataxia   Musculoskeletal System No extrapyramidal symptoms (EPS); no abnormal muscular movements or Tardive Dyskinesia (TD); muscle strength and tone are within normal limits   Language No aphasia or dysarthria   Speech:  hypoverbal   Thought Processes coherent; normal rate of thoughts; poor abstract reasoning/computation   Thought Associations goal directed   Thought Content internally preoccupied   Suicidal Ideations none   Homicidal  Ideations none   Mood:  anxious    Affect:  sad   Memory recent  good   Memory remote:  good   Concentration/Attention:  distractable   Fund of Knowledge average   Insight:  limited   Reliability poor   Judgment:  limited          VITALS:     Patient Vitals for the  past 24 hrs:   Temp Pulse Resp BP SpO2   07/02/18 0746 97.8 ??F (36.6 ??C) 64 16 115/65 99 %   07/01/18 1938 98.2 ??F (36.8 ??C) 78 16 109/67 100 %     Wt Readings from Last 3 Encounters:   06/26/18 63.5 kg (140 lb)     Temp Readings from Last 3 Encounters:   07/02/18 97.8 ??F (36.6 ??C)     BP Readings from Last 3 Encounters:   07/02/18 115/65     Pulse Readings from Last 3 Encounters:   07/02/18 64            DATA     LABORATORY DATA:  Labs Reviewed   TSH 3RD GENERATION - Abnormal; Notable for the following components:       Result Value    TSH 6.43 (*)     All other components within normal limits   LIPID PANEL - Abnormal; Notable for the following components:    Cholesterol, total 264 (*)     LDL, calculated 183.2 (*)     All other components within normal limits   GLUCOSE, FASTING     Admission on 06/26/2018   Component Date Value Ref Range Status   ??? Glucose 06/27/2018 98  65 - 100 MG/DL Final   ??? TSH 16/10/960407/06/2018 6.43* 0.36 - 3.74 uIU/mL Final   ??? LIPID PROFILE 06/27/2018        Final   ??? Cholesterol, total 06/27/2018 264* <200 MG/DL Final   ??? Triglyceride 06/27/2018 104  <150 MG/DL Final   ??? HDL Cholesterol 06/27/2018 60  MG/DL Final   ??? LDL, calculated 06/27/2018 183.2* 0 - 100 MG/DL Final   ??? VLDL, calculated 06/27/2018 20.8  MG/DL Final   ??? CHOL/HDL Ratio 06/27/2018 4.4  0.0 - 5.0   Final        RADIOLOGY REPORTS:  No results found for this or any previous visit.No results found.           MEDICATIONS       ALL MEDICATIONS  Current Facility-Administered Medications   Medication Dose Route Frequency   ??? QUEtiapine SR (SEROquel XR) tablet 300 mg  300 mg Oral QHS   ??? ziprasidone (GEODON) 20 mg in sterile water (preservative free) 1 mL injection  20 mg  IntraMUSCular BID PRN   ??? OLANZapine (ZyPREXA) tablet 5 mg  5 mg Oral Q6H PRN   ??? benztropine (COGENTIN) tablet 2 mg  2 mg Oral BID PRN   ??? benztropine (COGENTIN) injection 2 mg  2 mg IntraMUSCular BID PRN   ??? LORazepam (ATIVAN) injection 2 mg  2 mg IntraMUSCular Q4H PRN   ??? acetaminophen (TYLENOL) tablet 650 mg  650 mg Oral Q4H PRN   ??? magnesium hydroxide (MILK OF MAGNESIA) 400 mg/5 mL oral suspension 30 mL  30 mL Oral DAILY PRN   ??? nicotine (NICODERM CQ) 21 mg/24 hr patch 1 Patch  1 Patch TransDERmal DAILY PRN   ??? hydrOXYzine HCl (ATARAX) tablet 50 mg  50 mg Oral Q6H PRN   ??? traZODone (DESYREL) tablet 50 mg  50 mg Oral QHS PRN      SCHEDULED MEDICATIONS  Current Facility-Administered Medications   Medication Dose Route Frequency   ??? QUEtiapine SR (SEROquel XR) tablet 300 mg  300 mg Oral QHS                ASSESSMENT & PLAN        The patient, Kenneth Hill,  is a 22 y.o.  male who presents at this time for treatment of the following diagnoses:  Patient Active Hospital Problem List:   Schizoaffective disorder (HCC) (06/26/2018)    Assessment: Presents distractible, denies current AVH, paranoid delusions reported at the time of ER eval.    Plan:   - CONTINUE Seroquel XR 300 mg QHS for psychosis  - Discharge planning (rehab vs home)         A coordinated, multidisplinary treatment team (includes the nurse, unit pharmcist, Administrator) round was conducted for this initial evaluation with the patient present.     The following regarding medications was addressed during rounds with patient:   the risks and benefits of the proposed medication. The patient was given the opportunity to ask questions. Informed consent given to the use of the above medications.     I will continue to adjust psychiatric and non-psychiatric medications (see above "medication" section and orders section for details) as deemed appropriate & based upon diagnoses and response to treatment.     I have reviewed admission (and previous/old)  labs and medical tests in the EHR and or transferring hospital documents. I will continue to order blood tests/labs and diagnostic tests as deemed appropriate and review results as they become available (see orders for details).    I have reviewed old psychiatric and medical records available in the EHR. I Will order additional psychiatric records from other institutions to further elucidate the nature of patient's psychopathology and review once available.    I will gather additional collateral information from friends, family and o/p treatment team to further elucidate the nature of patient's psychopathology and baselline level of psychiatric functioning.      ESTIMATED LENGTH OF STAY:  07/05/2018       STRENGTHS:  Access to housing/residential stability, Interpersonal/supportive relationships (family, friends, peers) and Awareness of Substance abuse issues                                        SIGNED:    Hiram Gash, MD  07/02/2018

## 2018-07-02 NOTE — Progress Notes (Signed)
[id number] Kenneth [name] RN received report from [facility] Sk[facility] RN.    0746 Pt up in room in bed. Pt presents with a cooperative attitude, flat affect, and depressed mood. Pt is preoccupied. Pt denies SI, HI, and AVH. Pt is alert and oriented x 4. Pt isolative to the room throughout most of the morning.    [id number] Pt ate 75% of his breakfast. Pt interacting well with staff and peers.     1230 Pt's father called to inquire about pt's condition. This Clinical research associatewriter accepted the call, then later gave the phone to the pt's attending psychiatrist. Pt's father spoke with the pt's attending psychiatrist regarding the pt's condition.     1500 Pt in dayroom on the phone. Pt calm.

## 2018-07-02 NOTE — Progress Notes (Signed)
1900 Received report from Rozanna BoerAnnilee Sykes, RN    2100 Pt isolated to room most of the evening, came out only for snacks and medications. Limited interactions with peers and staff noted.

## 2018-07-02 NOTE — Behavioral Health Treatment Team (Deleted)
GROUP THERAPY PROGRESS NOTE    The patient Kenneth Hill a 22 y.o. male is participating in [employer] Expression Group.     Group time: 1 hour    Personal goal for participation: To concentrate on selected task    Goal orientation: social    Group therapy participation: active    Therapeutic interventions reviewed and discussed: Crafts, games, music    Impression of participation: The patient was attentive.    Audelia HivesBeverly S Baker  07/02/2018  4:47 PM

## 2018-07-02 NOTE — Behavioral Health Treatment Team (Signed)
0600-Patient has slept 8 hours so far and is currently still sleeping. Will continue to monitor per protocol.

## 2018-07-02 NOTE — Progress Notes (Signed)
1900 Assumed care of pt   2030 Pt med compliant. Denies SI/HI/AVH. No complaints voiced. Visible in milieu, interacting minimally but appropriately with peers and staff.  0000 Appears to be sleeping. No S/S of acute distress noted  0550 Pt rested 6 hours overnight. Hourly rounds and Q15 minutes maintained and to continue.

## 2018-07-03 MED FILL — NICOTINE 21 MG/24 HR DAILY PATCH: 21 mg/24 hr | TRANSDERMAL | Qty: 1

## 2018-07-03 MED FILL — SEROQUEL XR 300 MG TABLET,EXTENDED RELEASE: 300 mg | ORAL | Qty: 1

## 2018-07-03 NOTE — Behavioral Health Treatment Team (Signed)
PSYCHIATRIC PROGRESS NOTE    Weekend Coverage    IDENTIFICATION:    Patient Name  Kenneth Hill   Date of Birth 1996/05/17   CSN [id number]   Medical Record Number  [id number]      Age  22 y.o.   PCP None   Admit date:  06/26/2018    Room Number  312/01  @ [facility]   Date of Service  07/03/2018            HISTORY         REASON FOR HOSPITALIZATION:  CC:  Pt admitted under a temporary detention order (TDO) with severe psychosis and proving to be an imminent danger to self and others.    HISTORY OF PRESENT ILLNESS:    The patient, Kenneth Hill, is a 22 y.o.  [place] male with a past psychiatric history significant for Psychosis, who presents at this time with complaints of (and/or evidence of) the following emotional symptoms: agitation, delusions, paranoid behavior, anxiety and psychosis.  Additional symptomatology include anxiety, fearfulness, poor concentration and problem with medication.  The above symptoms have been present for a week. These symptoms are of high severity. These symptoms are constant and intermittent/ fleeting in nature.  The patient's condition has been precipitated by multiple psychosocial stressors .  Patient's condition made worse by continued illicit drug use as well as treatment noncompliance. UDS: +MJ, Amphetamines; BAL=0.   Pt is a [place] male and just returned from [place]. He presents very distractible. He has been on various anti-psychotics in the past and refuses to start one on. Reluctantly agreed to a trial of Seroquel.  7/7-Appears less anxious today but is very doubtful about taking Psych meds. Still has delusional thoughts. Slept 4 hrs. Prn used Atarax. Mom visited yesterday.  7/8- patient has been visible, continues to be grossly disorganized. Patient with ongoing delusions per nursing; tolerating medication thus far. Denies SI/HI/AVH on interview. Endorses MJ use.  7/9- no acute overnight  Events. Patient isolative, odd. He states that  Seroquel is too strong. Slept 8.75 hours. Patient denies AVH but is noted to be internally preoccupied. Discharge focused on interview. Patient amenable to starting XR formulation of Seroquel for ongoing symptom control though he is wary of the higher dose.  7/10- no acute overnight events, patient visited by family, had a verbal altercation with brother who left afterwards per nursing. Patient medication compliant, complains of sedation from Seroquel but otherwise is more coherent. Discharge focused, no complaints. Still disorganized and grossly internally preoccupied.  7/11- patient muted, blunted. Out for meals and isolative to room otherwise. Patient discharge focused, medication compliant still c/o sedation. Slept 8.25 hours.   7/12- no acute overnight events. Patient has been visible, no agitation noted. Patient still muted, discharge focused. Family is concerned that he may relapse shortly after discharge, want rehab for him.  7/13-  Reports feeling well and moods are good. Poverty of content of speech but appropriately responsive. Denies SI/HI/AH/VH.  No aggression or violence.  Appropriately interactive and aware. Tolerating medications well.  Eating and sleeping fairly.     ALLERGIES:   Allergies   Allergen Reactions   ??? Aspirin Swelling      MEDICATIONS PRIOR TO ADMISSION:   No medications prior to admission.      PAST MEDICAL HISTORY:   No past medical history on file.No past surgical history on file.   SOCIAL HISTORY: Per SW note   Social History     Socioeconomic  History   ??? Marital status: SINGLE     Spouse name: Not on file   ??? Number of children: Not on file   ??? Years of education: Not on file   ??? Highest education level: Not on file   Occupational History   ??? Not on file   Social Needs   ??? Financial resource strain: Not on file   ??? Food insecurity:     Worry: Not on file     Inability: Not on file   ??? Transportation needs:     Medical: Not on file     Non-medical: Not on file   Tobacco Use    ??? Smoking status: Not on file   Substance and Sexual Activity   ??? Alcohol use: Not on file   ??? Drug use: Not on file   ??? Sexual activity: Not on file   Lifestyle   ??? Physical activity:     Days per week: Not on file     Minutes per session: Not on file   ??? Stress: Not on file   Relationships   ??? Social connections:     Talks on phone: Not on file     Gets together: Not on file     Attends religious service: Not on file     Active member of club or organization: Not on file     Attends meetings of clubs or organizations: Not on file     Relationship status: Not on file   ??? Intimate partner violence:     Fear of current or ex partner: Not on file     Emotionally abused: Not on file     Physically abused: Not on file     Forced sexual activity: Not on file   Other Topics Concern   ??? Not on file   Social History Narrative   ??? Not on file      FAMILY HISTORY: History reviewed. No pertinent family history.   No family history on file.    REVIEW OF SYSTEMS:   Psychological ROS: positive for - anxiety, concentration difficulties and sleep disturbances  negative for - disorientation, hallucinations or hostility  Pertinent items are noted in the History of Present Illness.  All other Systems reviewed and are considered negative.           MENTAL STATUS EXAM & VITALS     MENTAL STATUS EXAM (MSE):    MSE FINDINGS ARE WITHIN NORMAL LIMITS (WNL) UNLESS OTHERWISE STATED BELOW. ( ALL OF THE BELOW CATEGORIES OF THE MSE HAVE BEEN REVIEWED (reviewed 07/03/2018) AND UPDATED AS DEEMED APPROPRIATE )  General Presentation age appropriate and casually dressed, cooperative   Orientation oriented to time, place and person   Vital Signs  See below (reviewed 07/03/2018); Vital Signs (BP, Pulse, & Temp) are within normal limits if not listed below.   Gait and Station Stable/steady, no ataxia   Musculoskeletal System No extrapyramidal symptoms (EPS); no abnormal muscular movements or Tardive Dyskinesia (TD); muscle strength and tone  are within normal limits   Language No aphasia or dysarthria   Speech:  hypoverbal   Thought Processes coherent; normal rate of thoughts; poor abstract reasoning/computation   Thought Associations goal directed   Thought Content internally preoccupied   Suicidal Ideations none   Homicidal Ideations none   Mood:  anxious    Affect:  sad   Memory recent  good   Memory remote:  good   Concentration/Attention:  distractable  Fund of Knowledge average   Insight:  limited   Reliability poor   Judgment:  limited          VITALS:     Patient Vitals for the past 24 hrs:   Temp Pulse Resp BP SpO2   07/03/18 0800 98 ??F (36.7 ??C) (!) 56 16 90/49 99 %   07/02/18 1947 98.5 ??F (36.9 ??C) 82 20 124/75 100 %     Wt Readings from Last 3 Encounters:   06/26/18 63.5 kg (140 lb)     Temp Readings from Last 3 Encounters:   07/03/18 98 ??F (36.7 ??C)     BP Readings from Last 3 Encounters:   07/03/18 90/49     Pulse Readings from Last 3 Encounters:   07/03/18 (!) 56            DATA     LABORATORY DATA:  Labs Reviewed   TSH 3RD GENERATION - Abnormal; Notable for the following components:       Result Value    TSH 6.43 (*)     All other components within normal limits   LIPID PANEL - Abnormal; Notable for the following components:    Cholesterol, total 264 (*)     LDL, calculated 183.2 (*)     All other components within normal limits   GLUCOSE, FASTING     Admission on 06/26/2018   Component Date Value Ref Range Status   ??? Glucose 06/27/2018 98  65 - 100 MG/DL Final   ??? TSH 96/04/540907/06/2018 6.43* 0.36 - 3.74 uIU/mL Final   ??? LIPID PROFILE 06/27/2018        Final   ??? Cholesterol, total 06/27/2018 264* <200 MG/DL Final   ??? Triglyceride 06/27/2018 104  <150 MG/DL Final   ??? HDL Cholesterol 06/27/2018 60  MG/DL Final   ??? LDL, calculated 06/27/2018 183.2* 0 - 100 MG/DL Final   ??? VLDL, calculated 06/27/2018 20.8  MG/DL Final   ??? CHOL/HDL Ratio 06/27/2018 4.4  0.0 - 5.0   Final        RADIOLOGY REPORTS:   No results found for this or any previous visit.No results found.           MEDICATIONS       ALL MEDICATIONS  Current Facility-Administered Medications   Medication Dose Route Frequency   ??? QUEtiapine SR (SEROquel XR) tablet 300 mg  300 mg Oral QHS   ??? ziprasidone (GEODON) 20 mg in sterile water (preservative free) 1 mL injection  20 mg IntraMUSCular BID PRN   ??? OLANZapine (ZyPREXA) tablet 5 mg  5 mg Oral Q6H PRN   ??? benztropine (COGENTIN) tablet 2 mg  2 mg Oral BID PRN   ??? benztropine (COGENTIN) injection 2 mg  2 mg IntraMUSCular BID PRN   ??? LORazepam (ATIVAN) injection 2 mg  2 mg IntraMUSCular Q4H PRN   ??? acetaminophen (TYLENOL) tablet 650 mg  650 mg Oral Q4H PRN   ??? magnesium hydroxide (MILK OF MAGNESIA) 400 mg/5 mL oral suspension 30 mL  30 mL Oral DAILY PRN   ??? nicotine (NICODERM CQ) 21 mg/24 hr patch 1 Patch  1 Patch TransDERmal DAILY PRN   ??? hydrOXYzine HCl (ATARAX) tablet 50 mg  50 mg Oral Q6H PRN   ??? traZODone (DESYREL) tablet 50 mg  50 mg Oral QHS PRN      SCHEDULED MEDICATIONS  Current Facility-Administered Medications   Medication Dose Route Frequency   ??? QUEtiapine SR (SEROquel XR)  tablet 300 mg  300 mg Oral QHS              ASSESSMENT & PLAN        The patient, Kenneth Hill, is a 22 y.o.  male who presents at this time for treatment of the following diagnoses:  Patient Active Hospital Problem List:   Schizoaffective disorder (HCC) (06/26/2018)    Assessment: Presents distractible, denies current AVH, paranoid delusions reported at the time of ER eval.    Plan:   - CONTINUE Seroquel XR 300 mg QHS for psychosis  - Discharge planning (rehab vs home)         A coordinated, multidisplinary treatment team (includes the nurse, unit pharmcist, Administrator) round was conducted for this initial evaluation with the patient present.     The following regarding medications was addressed during rounds with patient:   the risks and benefits of the proposed medication. The patient was given  the opportunity to ask questions. Informed consent given to the use of the above medications.     I will continue to adjust psychiatric and non-psychiatric medications (see above "medication" section and orders section for details) as deemed appropriate & based upon diagnoses and response to treatment.     I have reviewed admission (and previous/old) labs and medical tests in the EHR and or transferring hospital documents. I will continue to order blood tests/labs and diagnostic tests as deemed appropriate and review results as they become available (see orders for details).    I have reviewed old psychiatric and medical records available in the EHR. I Will order additional psychiatric records from other institutions to further elucidate the nature of patient's psychopathology and review once available.    I will gather additional collateral information from friends, family and o/p treatment team to further elucidate the nature of patient's psychopathology and baselline level of psychiatric functioning.      ESTIMATED LENGTH OF STAY:  07/05/2018       STRENGTHS:  Access to housing/residential stability, Interpersonal/supportive relationships (family, friends, peers) and Awareness of Substance abuse issues                                        SIGNED:    Lorie Phenix, MD  07/03/2018

## 2018-07-03 NOTE — Progress Notes (Addendum)
1900 Assumed care of pt  2000 Visible in milieu, interacting appropriately with peers  2100 Pt med complaint with HS medications. PRN Trazodone given per request.  2230 Pt resting quietly in bed.  0200 Appears to be sleeping. No S/S acute distress noted.   0600 Pt slept 6.5 hours. Hourly rounds and Q15 minute checks maintained and to continue

## 2018-07-03 NOTE — Behavioral Health Treatment Team (Incomplete)
Report received from Particia JasperAlexis G, RN at 0700.  Patient currently in bed sleeping.  Patient ate breakfast and returned to bed.  Patient compliant with vitals.    1235  Patient continues to be isolative in bed.  Patient sleeping, came out for lunch then returned to room to rest in bed again.    1432  Patient in dayroom watching movie.

## 2018-07-03 NOTE — Behavioral Health Treatment Team (Signed)
PSYCHIATRIC PROGRESS NOTE      Weekend Coverage    IDENTIFICATION:    Patient Name  Kenneth Hill   Date of Birth [birth year]   CSN [id number]   Medical Record Number  [id number]      Age  22 y.o.   PCP None   Admit date:  06/26/2018    Room Number  312/01  @ [facility]   Date of Service  07/03/2018            HISTORY         REASON FOR HOSPITALIZATION:  CC:  Pt admitted under a temporary detention order (TDO) with severe psychosis and proving to be an imminent danger to self and others.    HISTORY OF PRESENT ILLNESS:    The patient, Kenneth Hill, is a 22 y.o.  [place] male with a past psychiatric history significant for Psychosis, who presents at this time with complaints of (and/or evidence of) the following emotional symptoms: agitation, delusions, paranoid behavior, anxiety and psychosis.  Additional symptomatology include anxiety, fearfulness, poor concentration and problem with medication.  The above symptoms have been present for a week. These symptoms are of high severity. These symptoms are constant and intermittent/ fleeting in nature.  The patient's condition has been precipitated by multiple psychosocial stressors .  Patient's condition made worse by continued illicit drug use as well as treatment noncompliance. UDS: +MJ, Amphetamines; BAL=0.   Pt is a [place] male and just returned from [place]. He presents very distractible. He has been on various anti-psychotics in the past and refuses to start one on. Reluctantly agreed to a trial of Seroquel.  7/7-Appears less anxious today but is very doubtful about taking Psych meds. Still has delusional thoughts. Slept 4 hrs. Prn used Atarax. Mom visited yesterday.  7/8- patient has been visible, continues to be grossly disorganized. Patient with ongoing delusions per nursing; tolerating medication thus far. Denies SI/HI/AVH on interview. Endorses MJ use.  7/9- no acute overnight  Events. Patient isolative, odd. He states that Seroquel is too  strong. Slept 8.75 hours. Patient denies AVH but is noted to be internally preoccupied. Discharge focused on interview. Patient amenable to starting XR formulation of Seroquel for ongoing symptom control though he is wary of the higher dose.  7/10- no acute overnight events, patient visited by family, had a verbal altercation with brother who left afterwards per nursing. Patient medication compliant, complains of sedation from Seroquel but otherwise is more coherent. Discharge focused, no complaints. Still disorganized and grossly internally preoccupied.  7/11- patient muted, blunted. Out for meals and isolative to room otherwise. Patient discharge focused, medication compliant still c/o sedation. Slept 8.25 hours.   7/12- no acute overnight events. Patient has been visible, no agitation noted. Patient still muted, discharge focused. Family is concerned that he may relapse shortly after discharge, want rehab for him.  7/13-  Reports feeling well and moods are good. Poverty of content of speech but appropriately responsive. Denies SI/HI/AH/VH.  No aggression or violence.  Appropriately interactive and aware. Tolerating medications well.  Eating and sleeping fairly.     ALLERGIES:   Allergies   Allergen Reactions   ??? Aspirin Swelling      MEDICATIONS PRIOR TO ADMISSION:   No medications prior to admission.      PAST MEDICAL HISTORY:   No past medical history on file.No past surgical history on file.   SOCIAL HISTORY: Per SW note   Social History  Socioeconomic History   ??? Marital status: SINGLE     Spouse name: Not on file   ??? Number of children: Not on file   ??? Years of education: Not on file   ??? Highest education level: Not on file   Occupational History   ??? Not on file   Social Needs   ??? Financial resource strain: Not on file   ??? Food insecurity:     Worry: Not on file     Inability: Not on file   ??? Transportation needs:     Medical: Not on file     Non-medical: Not on file   Tobacco Use   ??? Smoking status: Not  on file   Substance and Sexual Activity   ??? Alcohol use: Not on file   ??? Drug use: Not on file   ??? Sexual activity: Not on file   Lifestyle   ??? Physical activity:     Days per week: Not on file     Minutes per session: Not on file   ??? Stress: Not on file   Relationships   ??? Social connections:     Talks on phone: Not on file     Gets together: Not on file     Attends religious service: Not on file     Active member of club or organization: Not on file     Attends meetings of clubs or organizations: Not on file     Relationship status: Not on file   ??? Intimate partner violence:     Fear of current or ex partner: Not on file     Emotionally abused: Not on file     Physically abused: Not on file     Forced sexual activity: Not on file   Other Topics Concern   ??? Not on file   Social History Narrative   ??? Not on file      FAMILY HISTORY: History reviewed. No pertinent family history.   No family history on file.    REVIEW OF SYSTEMS:   Psychological ROS: positive for - anxiety, concentration difficulties and sleep disturbances  negative for - disorientation, hallucinations or hostility  Pertinent items are noted in the History of Present Illness.  All other Systems reviewed and are considered negative.           MENTAL STATUS EXAM & VITALS     MENTAL STATUS EXAM (MSE):    MSE FINDINGS ARE WITHIN NORMAL LIMITS (WNL) UNLESS OTHERWISE STATED BELOW. ( ALL OF THE BELOW CATEGORIES OF THE MSE HAVE BEEN REVIEWED (reviewed 07/03/2018) AND UPDATED AS DEEMED APPROPRIATE )  General Presentation age appropriate and casually dressed, cooperative   Orientation oriented to time, place and person   Vital Signs  See below (reviewed 07/03/2018); Vital Signs (BP, Pulse, & Temp) are within normal limits if not listed below.   Gait and Station Stable/steady, no ataxia   Musculoskeletal System No extrapyramidal symptoms (EPS); no abnormal muscular movements or Tardive Dyskinesia (TD); muscle strength and tone are within normal limits   Language  No aphasia or dysarthria   Speech:  hypoverbal   Thought Processes coherent; normal rate of thoughts; poor abstract reasoning/computation   Thought Associations goal directed   Thought Content internally preoccupied   Suicidal Ideations none   Homicidal Ideations none   Mood:  anxious    Affect:  sad   Memory recent  good   Memory remote:  good   Concentration/Attention:  distractable  Fund of Knowledge average   Insight:  limited   Reliability poor   Judgment:  limited          VITALS:     Patient Vitals for the past 24 hrs:   Temp Pulse Resp BP SpO2   07/03/18 0800 98 ??F (36.7 ??C) (!) 56 16 90/49 99 %   07/02/18 1947 98.5 ??F (36.9 ??C) 82 20 124/75 100 %     Wt Readings from Last 3 Encounters:   06/26/18 63.5 kg (140 lb)     Temp Readings from Last 3 Encounters:   07/03/18 98 ??F (36.7 ??C)     BP Readings from Last 3 Encounters:   07/03/18 90/49     Pulse Readings from Last 3 Encounters:   07/03/18 (!) 56            DATA     LABORATORY DATA:  Labs Reviewed   TSH 3RD GENERATION - Abnormal; Notable for the following components:       Result Value    TSH 6.43 (*)     All other components within normal limits   LIPID PANEL - Abnormal; Notable for the following components:    Cholesterol, total 264 (*)     LDL, calculated 183.2 (*)     All other components within normal limits   GLUCOSE, FASTING     Admission on 06/26/2018   Component Date Value Ref Range Status   ??? Glucose 06/27/2018 98  65 - 100 MG/DL Final   ??? TSH 16/09/9603 6.43* 0.36 - 3.74 uIU/mL Final   ??? LIPID PROFILE 06/27/2018        Final   ??? Cholesterol, total 06/27/2018 264* <200 MG/DL Final   ??? Triglyceride 06/27/2018 104  <150 MG/DL Final   ??? HDL Cholesterol 06/27/2018 60  MG/DL Final   ??? LDL, calculated 06/27/2018 183.2* 0 - 100 MG/DL Final   ??? VLDL, calculated 06/27/2018 20.8  MG/DL Final   ??? CHOL/HDL Ratio 06/27/2018 4.4  0.0 - 5.0   Final        RADIOLOGY REPORTS:  No results found for this or any previous visit.No results found.           MEDICATIONS        ALL MEDICATIONS  Current Facility-Administered Medications   Medication Dose Route Frequency   ??? QUEtiapine SR (SEROquel XR) tablet 300 mg  300 mg Oral QHS   ??? ziprasidone (GEODON) 20 mg in sterile water (preservative free) 1 mL injection  20 mg IntraMUSCular BID PRN   ??? OLANZapine (ZyPREXA) tablet 5 mg  5 mg Oral Q6H PRN   ??? benztropine (COGENTIN) tablet 2 mg  2 mg Oral BID PRN   ??? benztropine (COGENTIN) injection 2 mg  2 mg IntraMUSCular BID PRN   ??? LORazepam (ATIVAN) injection 2 mg  2 mg IntraMUSCular Q4H PRN   ??? acetaminophen (TYLENOL) tablet 650 mg  650 mg Oral Q4H PRN   ??? magnesium hydroxide (MILK OF MAGNESIA) 400 mg/5 mL oral suspension 30 mL  30 mL Oral DAILY PRN   ??? nicotine (NICODERM CQ) 21 mg/24 hr patch 1 Patch  1 Patch TransDERmal DAILY PRN   ??? hydrOXYzine HCl (ATARAX) tablet 50 mg  50 mg Oral Q6H PRN   ??? traZODone (DESYREL) tablet 50 mg  50 mg Oral QHS PRN      SCHEDULED MEDICATIONS  Current Facility-Administered Medications   Medication Dose Route Frequency   ??? QUEtiapine SR (SEROquel XR)  tablet 300 mg  300 mg Oral QHS                ASSESSMENT & PLAN        The patient, Kenneth Hill, is a 22 y.o.  male who presents at this time for treatment of the following diagnoses:  Patient Active Hospital Problem List:   Schizoaffective disorder (HCC) (06/26/2018)    Assessment: Presents distractible, denies current AVH, paranoid delusions reported at the time of ER eval.    Plan:   - CONTINUE Seroquel XR 300 mg QHS for psychosis  - Discharge planning (rehab vs home)         A coordinated, multidisplinary treatment team (includes the nurse, unit pharmcist, Administrator) round was conducted for this initial evaluation with the patient present.     The following regarding medications was addressed during rounds with patient:   the risks and benefits of the proposed medication. The patient was given the opportunity to ask questions. Informed consent given to the use of the above medications.     I  will continue to adjust psychiatric and non-psychiatric medications (see above "medication" section and orders section for details) as deemed appropriate & based upon diagnoses and response to treatment.     I have reviewed admission (and previous/old) labs and medical tests in the EHR and or transferring hospital documents. I will continue to order blood tests/labs and diagnostic tests as deemed appropriate and review results as they become available (see orders for details).    I have reviewed old psychiatric and medical records available in the EHR. I Will order additional psychiatric records from other institutions to further elucidate the nature of patient's psychopathology and review once available.    I will gather additional collateral information from friends, family and o/p treatment team to further elucidate the nature of patient's psychopathology and baselline level of psychiatric functioning.      ESTIMATED LENGTH OF STAY:  07/05/2018       STRENGTHS:  Access to housing/residential stability, Interpersonal/supportive relationships (family, friends, peers) and Awareness of Substance abuse issues                                        SIGNED:    Lorie Phenix, MD  07/03/2018

## 2018-07-03 NOTE — Progress Notes (Signed)
1900 Assumed care of pt  2000 Visible in milieu, interacting appropriately with peers  2100 Pt med complaint with HS medications. PRN Trazodone given per request.  2230 Pt resting quietly in bed.  0200 Appears to be sleeping. No S/S acute distress noted.   0600 Pt slept 6.5 hours. Hourly rounds and Q15 minute checks maintained and to continue

## 2018-07-04 MED FILL — TRAZODONE 50 MG TAB: 50 mg | ORAL | Qty: 1

## 2018-07-04 MED FILL — NICOTINE 21 MG/24 HR DAILY PATCH: 21 mg/24 hr | TRANSDERMAL | Qty: 1

## 2018-07-04 MED FILL — MAPAP (ACETAMINOPHEN) 325 MG TABLET: 325 mg | ORAL | Qty: 2

## 2018-07-04 NOTE — Behavioral Health Treatment Team (Addendum)
0700-Patient report received from Alexis Green, RN    0800-Patient ate breakfast    0930-Patient went back to his room and laid down in bed. Patient denies SI/HI and AV/HV. Patient denies pain.     1026-Patient still remains in bed and appears to be sleeping.    1234-Patient in the dayroom eating lunch.     1805-Patient calm and cooperative and ate dinner in the dayroom.

## 2018-07-04 NOTE — Behavioral Health Treatment Team (Signed)
PSYCHIATRIC PROGRESS NOTE    Weekend Coverage    IDENTIFICATION:    Patient Name  Kenneth HammingViet Quoc Wellen   Date of Birth 03-22-1996   CSN [id number]   Medical Record Number  [id number]      Age  22 y.o.   PCP None   Admit date:  06/26/2018    Room Number  327/01  [facility]   Date of Service  07/05/2018            HISTORY         REASON FOR HOSPITALIZATION:  CC:  Pt admitted under a temporary detention order (TDO) with severe psychosis and proving to be an imminent danger to self and others.    HISTORY OF PRESENT ILLNESS:    The patient, Kenneth Hill, is a 22 y.o.  [place] male with a past psychiatric history significant for Psychosis, who presents at this time with complaints of (and/or evidence of) the following emotional symptoms: agitation, delusions, paranoid behavior, anxiety and psychosis.  Additional symptomatology include anxiety, fearfulness, poor concentration and problem with medication.  The above symptoms have been present for a week. These symptoms are of high severity. These symptoms are constant and intermittent/ fleeting in nature.  The patient's condition has been precipitated by multiple psychosocial stressors .  Patient's condition made worse by continued illicit drug use as well as treatment noncompliance. UDS: +MJ, Amphetamines; BAL=0.   Pt is a [place] male and just returned from [place]. He presents very distractible. He has been on various anti-psychotics in the past and refuses to start one on. Reluctantly agreed to a trial of Seroquel.  7/7-Appears less anxious today but is very doubtful about taking Psych meds. Still has delusional thoughts. Slept 4 hrs. Prn used Atarax. Mom visited yesterday.  7/8- patient has been visible, continues to be grossly disorganized. Patient with ongoing delusions per nursing; tolerating medication thus far. Denies SI/HI/AVH on interview. Endorses MJ use.  7/9- no acute overnight  Events. Patient isolative, odd. He states that  Seroquel is too strong. Slept 8.75 hours. Patient denies AVH but is noted to be internally preoccupied. Discharge focused on interview. Patient amenable to starting XR formulation of Seroquel for ongoing symptom control though he is wary of the higher dose.  7/10- no acute overnight events, patient visited by family, had a verbal altercation with brother who left afterwards per nursing. Patient medication compliant, complains of sedation from Seroquel but otherwise is more coherent. Discharge focused, no complaints. Still disorganized and grossly internally preoccupied.  7/11- patient muted, blunted. Out for meals and isolative to room otherwise. Patient discharge focused, medication compliant still c/o sedation. Slept 8.25 hours.   7/12- no acute overnight events. Patient has been visible, no agitation noted. Patient still muted, discharge focused. Family is concerned that he may relapse shortly after discharge, want rehab for him.  7/13-  Reports feeling well and moods are good. Poverty of content of speech but appropriately responsive. Denies SI/HI/AH/VH.  No aggression or violence.  Appropriately interactive and aware. Tolerating medications well.  Eating and sleeping fairly.  7/14-   reports feeling well and moods are good.  Denies SI/HI/AH/VH.  No aggression or violence.  Appropriately interactive and aware. Tolerating medications well.  Eating and sleeping fairly.  Requesting to leave tomorrow     ALLERGIES:   Allergies   Allergen Reactions   ??? Aspirin Swelling      MEDICATIONS PRIOR TO ADMISSION:   No medications prior to admission.  PAST MEDICAL HISTORY:   No past medical history on file.No past surgical history on file.   SOCIAL HISTORY: Per SW note   Social History     Socioeconomic History   ??? Marital status: SINGLE     Spouse name: Not on file   ??? Number of children: Not on file   ??? Years of education: Not on file   ??? Highest education level: Not on file   Occupational History   ??? Not on file    Social Needs   ??? Financial resource strain: Not on file   ??? Food insecurity:     Worry: Not on file     Inability: Not on file   ??? Transportation needs:     Medical: Not on file     Non-medical: Not on file   Tobacco Use   ??? Smoking status: Not on file   Substance and Sexual Activity   ??? Alcohol use: Not on file   ??? Drug use: Not on file   ??? Sexual activity: Not on file   Lifestyle   ??? Physical activity:     Days per week: Not on file     Minutes per session: Not on file   ??? Stress: Not on file   Relationships   ??? Social connections:     Talks on phone: Not on file     Gets together: Not on file     Attends religious service: Not on file     Active member of club or organization: Not on file     Attends meetings of clubs or organizations: Not on file     Relationship status: Not on file   ??? Intimate partner violence:     Fear of current or ex partner: Not on file     Emotionally abused: Not on file     Physically abused: Not on file     Forced sexual activity: Not on file   Other Topics Concern   ??? Not on file   Social History Narrative   ??? Not on file      FAMILY HISTORY: History reviewed. No pertinent family history.   No family history on file.    REVIEW OF SYSTEMS:   Psychological ROS: positive for - anxiety, concentration difficulties and sleep disturbances  negative for - disorientation, hallucinations or hostility  Pertinent items are noted in the History of Present Illness.  All other Systems reviewed and are considered negative.           MENTAL STATUS EXAM & VITALS     MENTAL STATUS EXAM (MSE):    MSE FINDINGS ARE WITHIN NORMAL LIMITS (WNL) UNLESS OTHERWISE STATED BELOW. ( ALL OF THE BELOW CATEGORIES OF THE MSE HAVE BEEN REVIEWED (reviewed 07/05/2018) AND UPDATED AS DEEMED APPROPRIATE )  General Presentation age appropriate and casually dressed, cooperative   Orientation oriented to time, place and person   Vital Signs  See below (reviewed 07/05/2018); Vital Signs (BP, Pulse, &  Temp) are within normal limits if not listed below.   Gait and Station Stable/steady, no ataxia   Musculoskeletal System No extrapyramidal symptoms (EPS); no abnormal muscular movements or Tardive Dyskinesia (TD); muscle strength and tone are within normal limits   Language No aphasia or dysarthria   Speech:  hypoverbal   Thought Processes coherent; normal rate of thoughts; poor abstract reasoning/computation   Thought Associations goal directed   Thought Content internally preoccupied   Suicidal Ideations none  Homicidal Ideations none   Mood:  anxious    Affect:  sad   Memory recent  good   Memory remote:  good   Concentration/Attention:  distractable   Fund of Knowledge average   Insight:  limited   Reliability poor   Judgment:  limited          VITALS:     Patient Vitals for the past 24 hrs:   Temp Pulse Resp BP SpO2   07/04/18 2023 (P) 98.2 ??F (36.8 ??C) (P) 88 (P) 16 (P) 140/86 (P) 100 %   07/04/18 0805 98 ??F (36.7 ??C) 90 16 121/84 100 %     Wt Readings from Last 3 Encounters:   06/26/18 63.5 kg (140 lb)     Temp Readings from Last 3 Encounters:   07/04/18 (P) 98.2 ??F (36.8 ??C)     BP Readings from Last 3 Encounters:   07/04/18 (P) 140/86     Pulse Readings from Last 3 Encounters:   07/04/18 (P) 88            DATA     LABORATORY DATA:  Labs Reviewed   TSH 3RD GENERATION - Abnormal; Notable for the following components:       Result Value    TSH 6.43 (*)     All other components within normal limits   LIPID PANEL - Abnormal; Notable for the following components:    Cholesterol, total 264 (*)     LDL, calculated 183.2 (*)     All other components within normal limits   GLUCOSE, FASTING     Admission on 06/26/2018   Component Date Value Ref Range Status   ??? Glucose 06/27/2018 98  65 - 100 MG/DL Final   ??? TSH 64/33/2951 6.43* 0.36 - 3.74 uIU/mL Final   ??? LIPID PROFILE 06/27/2018        Final   ??? Cholesterol, total 06/27/2018 264* <200 MG/DL Final   ??? Triglyceride 06/27/2018 104  <150 MG/DL Final    ??? HDL Cholesterol 06/27/2018 60  MG/DL Final   ??? LDL, calculated 06/27/2018 183.2* 0 - 100 MG/DL Final   ??? VLDL, calculated 06/27/2018 20.8  MG/DL Final   ??? CHOL/HDL Ratio 06/27/2018 4.4  0.0 - 5.0   Final        RADIOLOGY REPORTS:  No results found for this or any previous visit.No results found.           MEDICATIONS       ALL MEDICATIONS  Current Facility-Administered Medications   Medication Dose Route Frequency   ??? QUEtiapine SR (SEROquel XR) tablet 300 mg  300 mg Oral QHS   ??? ziprasidone (GEODON) 20 mg in sterile water (preservative free) 1 mL injection  20 mg IntraMUSCular BID PRN   ??? OLANZapine (ZyPREXA) tablet 5 mg  5 mg Oral Q6H PRN   ??? benztropine (COGENTIN) tablet 2 mg  2 mg Oral BID PRN   ??? benztropine (COGENTIN) injection 2 mg  2 mg IntraMUSCular BID PRN   ??? LORazepam (ATIVAN) injection 2 mg  2 mg IntraMUSCular Q4H PRN   ??? acetaminophen (TYLENOL) tablet 650 mg  650 mg Oral Q4H PRN   ??? magnesium hydroxide (MILK OF MAGNESIA) 400 mg/5 mL oral suspension 30 mL  30 mL Oral DAILY PRN   ??? nicotine (NICODERM CQ) 21 mg/24 hr patch 1 Patch  1 Patch TransDERmal DAILY PRN   ??? hydrOXYzine HCl (ATARAX) tablet 50 mg  50 mg Oral Q6H  PRN   ??? traZODone (DESYREL) tablet 50 mg  50 mg Oral QHS PRN      SCHEDULED MEDICATIONS  Current Facility-Administered Medications   Medication Dose Route Frequency   ??? QUEtiapine SR (SEROquel XR) tablet 300 mg  300 mg Oral QHS              ASSESSMENT & PLAN        The patient, Rajinder Mesick Kerekes, is a 22 y.o.  male who presents at this time for treatment of the following diagnoses:  Patient Active Hospital Problem List:   Schizoaffective disorder (HCC) (06/26/2018)    Assessment: Presents distractible, denies current AVH, paranoid delusions reported at the time of ER eval.    Plan:   - CONTINUE Seroquel XR 300 mg QHS for psychosis  - Discharge planning (rehab vs home)         A coordinated, multidisplinary treatment team (includes the nurse, unit  pharmcist, Administrator) round was conducted for this initial evaluation with the patient present.     The following regarding medications was addressed during rounds with patient:   the risks and benefits of the proposed medication. The patient was given the opportunity to ask questions. Informed consent given to the use of the above medications.     I will continue to adjust psychiatric and non-psychiatric medications (see above "medication" section and orders section for details) as deemed appropriate & based upon diagnoses and response to treatment.     I have reviewed admission (and previous/old) labs and medical tests in the EHR and or transferring hospital documents. I will continue to order blood tests/labs and diagnostic tests as deemed appropriate and review results as they become available (see orders for details).    I have reviewed old psychiatric and medical records available in the EHR. I Will order additional psychiatric records from other institutions to further elucidate the nature of patient's psychopathology and review once available.    I will gather additional collateral information from friends, family and o/p treatment team to further elucidate the nature of patient's psychopathology and baselline level of psychiatric functioning.      ESTIMATED LENGTH OF STAY:  07/05/2018       STRENGTHS:  Access to housing/residential stability, Interpersonal/supportive relationships (family, friends, peers) and Awareness of Substance abuse issues                                        SIGNED:    Lorie Phenix, MD  07/05/2018

## 2018-07-04 NOTE — Progress Notes (Signed)
Patient contracts for safety.

## 2018-07-04 NOTE — Progress Notes (Signed)
Diet as tolerated.  Pt noted to be meal compliant. Hx remarkable for TSH at 6.43  Ht: 5'6"  Wt: 140 lb  BMI: 22.6 kg/(m^@) c/w normal weight  Est energy needs: 1785 kcal, 68 g protein, 1 mL/kcal fluids  Pt will consume > 75% of meals at follow up 7-10 days  LOS

## 2018-07-04 NOTE — Progress Notes (Signed)
 Diet as tolerated.  Pt noted to be meal compliant. Hx remarkable for TSH at 6.43  Ht: 5'6  Wt: 140 lb  BMI: 22.6 kg/(m^@) c/w normal weight  Est energy needs: 1785 kcal, 68 g protein, 1 mL/kcal fluids  Pt will consume > 75% of meals at follow up 7-10 days  LOS

## 2018-07-04 NOTE — Behavioral Health Treatment Team (Signed)
0700-Patient report received from Wandra FeinsteinAlexis Green, RN    0800-Patient ate breakfast    0930-Patient went back to his room and laid down in bed. Patient denies SI/HI and AV/HV. Patient denies pain.     1026-Patient still remains in bed and appears to be sleeping.    1234-Patient in the dayroom eating lunch.     1805-Patient calm and cooperative and ate dinner in the dayroom.

## 2018-07-04 NOTE — Behavioral Health Treatment Team (Signed)
PSYCHIATRIC PROGRESS NOTE      Weekend Coverage    IDENTIFICATION:    Patient Name  Kenneth Hill   Date of Birth February 02, 1996   CSN [id number]   Medical Record Number  [id number]      Age  22 y.o.   PCP None   Admit date:  06/26/2018    Room Number  327/01  @ [facility]   Date of Service  07/05/2018            HISTORY         REASON FOR HOSPITALIZATION:  CC:  Pt admitted under a temporary detention order (TDO) with severe psychosis and proving to be an imminent danger to self and others.    HISTORY OF PRESENT ILLNESS:    The patient, Kenneth Hill, is a 22 y.o.  [place] male with a past psychiatric history significant for Psychosis, who presents at this time with complaints of (and/or evidence of) the following emotional symptoms: agitation, delusions, paranoid behavior, anxiety and psychosis.  Additional symptomatology include anxiety, fearfulness, poor concentration and problem with medication.  The above symptoms have been present for a week. These symptoms are of high severity. These symptoms are constant and intermittent/ fleeting in nature.  The patient's condition has been precipitated by multiple psychosocial stressors .  Patient's condition made worse by continued illicit drug use as well as treatment noncompliance. UDS: +MJ, Amphetamines; BAL=0.   Pt is a [place] male and just returned from [place]. He presents very distractible. He has been on various anti-psychotics in the past and refuses to start one on. Reluctantly agreed to a trial of Seroquel.  7/7-Appears less anxious today but is very doubtful about taking Psych meds. Still has delusional thoughts. Slept 4 hrs. Prn used Atarax. Mom visited yesterday.  7/8- patient has been visible, continues to be grossly disorganized. Patient with ongoing delusions per nursing; tolerating medication thus far. Denies SI/HI/AVH on interview. Endorses MJ use.  7/9- no acute overnight  Events. Patient isolative, odd. He states that Seroquel is too  strong. Slept 8.75 hours. Patient denies AVH but is noted to be internally preoccupied. Discharge focused on interview. Patient amenable to starting XR formulation of Seroquel for ongoing symptom control though he is wary of the higher dose.  7/10- no acute overnight events, patient visited by family, had a verbal altercation with brother who left afterwards per nursing. Patient medication compliant, complains of sedation from Seroquel but otherwise is more coherent. Discharge focused, no complaints. Still disorganized and grossly internally preoccupied.  7/11- patient muted, blunted. Out for meals and isolative to room otherwise. Patient discharge focused, medication compliant still c/o sedation. Slept 8.25 hours.   7/12- no acute overnight events. Patient has been visible, no agitation noted. Patient still muted, discharge focused. Family is concerned that he may relapse shortly after discharge, want rehab for him.  7/13-  Reports feeling well and moods are good. Poverty of content of speech but appropriately responsive. Denies SI/HI/AH/VH.  No aggression or violence.  Appropriately interactive and aware. Tolerating medications well.  Eating and sleeping fairly.  7/14-   reports feeling well and moods are good.  Denies SI/HI/AH/VH.  No aggression or violence.  Appropriately interactive and aware. Tolerating medications well.  Eating and sleeping fairly.  Requesting to leave tomorrow     ALLERGIES:   Allergies   Allergen Reactions   ??? Aspirin Swelling      MEDICATIONS PRIOR TO ADMISSION:   No medications prior  to admission.      PAST MEDICAL HISTORY:   No past medical history on file.No past surgical history on file.   SOCIAL HISTORY: Per SW note   Social History     Socioeconomic History   ??? Marital status: SINGLE     Spouse name: Not on file   ??? Number of children: Not on file   ??? Years of education: Not on file   ??? Highest education level: Not on file   Occupational History   ??? Not on file   Social Needs   ???  Financial resource strain: Not on file   ??? Food insecurity:     Worry: Not on file     Inability: Not on file   ??? Transportation needs:     Medical: Not on file     Non-medical: Not on file   Tobacco Use   ??? Smoking status: Not on file   Substance and Sexual Activity   ??? Alcohol use: Not on file   ??? Drug use: Not on file   ??? Sexual activity: Not on file   Lifestyle   ??? Physical activity:     Days per week: Not on file     Minutes per session: Not on file   ??? Stress: Not on file   Relationships   ??? Social connections:     Talks on phone: Not on file     Gets together: Not on file     Attends religious service: Not on file     Active member of club or organization: Not on file     Attends meetings of clubs or organizations: Not on file     Relationship status: Not on file   ??? Intimate partner violence:     Fear of current or ex partner: Not on file     Emotionally abused: Not on file     Physically abused: Not on file     Forced sexual activity: Not on file   Other Topics Concern   ??? Not on file   Social History Narrative   ??? Not on file      FAMILY HISTORY: History reviewed. No pertinent family history.   No family history on file.    REVIEW OF SYSTEMS:   Psychological ROS: positive for - anxiety, concentration difficulties and sleep disturbances  negative for - disorientation, hallucinations or hostility  Pertinent items are noted in the History of Present Illness.  All other Systems reviewed and are considered negative.           MENTAL STATUS EXAM & VITALS     MENTAL STATUS EXAM (MSE):    MSE FINDINGS ARE WITHIN NORMAL LIMITS (WNL) UNLESS OTHERWISE STATED BELOW. ( ALL OF THE BELOW CATEGORIES OF THE MSE HAVE BEEN REVIEWED (reviewed 07/05/2018) AND UPDATED AS DEEMED APPROPRIATE )  General Presentation age appropriate and casually dressed, cooperative   Orientation oriented to time, place and person   Vital Signs  See below (reviewed 07/05/2018); Vital Signs (BP, Pulse, & Temp) are within normal limits if not listed  below.   Gait and Station Stable/steady, no ataxia   Musculoskeletal System No extrapyramidal symptoms (EPS); no abnormal muscular movements or Tardive Dyskinesia (TD); muscle strength and tone are within normal limits   Language No aphasia or dysarthria   Speech:  hypoverbal   Thought Processes coherent; normal rate of thoughts; poor abstract reasoning/computation   Thought Associations goal directed   Thought Content internally preoccupied  Suicidal Ideations none   Homicidal Ideations none   Mood:  anxious    Affect:  sad   Memory recent  good   Memory remote:  good   Concentration/Attention:  distractable   Fund of Knowledge average   Insight:  limited   Reliability poor   Judgment:  limited          VITALS:     Patient Vitals for the past 24 hrs:   Temp Pulse Resp BP SpO2   07/04/18 2023 (P) 98.2 ??F (36.8 ??C) (P) 88 (P) 16 (P) 140/86 (P) 100 %   07/04/18 0805 98 ??F (36.7 ??C) 90 16 121/84 100 %     Wt Readings from Last 3 Encounters:   06/26/18 63.5 kg (140 lb)     Temp Readings from Last 3 Encounters:   07/04/18 (P) 98.2 ??F (36.8 ??C)     BP Readings from Last 3 Encounters:   07/04/18 (P) 140/86     Pulse Readings from Last 3 Encounters:   07/04/18 (P) 88            DATA     LABORATORY DATA:  Labs Reviewed   TSH 3RD GENERATION - Abnormal; Notable for the following components:       Result Value    TSH 6.43 (*)     All other components within normal limits   LIPID PANEL - Abnormal; Notable for the following components:    Cholesterol, total 264 (*)     LDL, calculated 183.2 (*)     All other components within normal limits   GLUCOSE, FASTING     Admission on 06/26/2018   Component Date Value Ref Range Status   ??? Glucose 06/27/2018 98  65 - 100 MG/DL Final   ??? TSH 16/10/960407/06/2018 6.43* 0.36 - 3.74 uIU/mL Final   ??? LIPID PROFILE 06/27/2018        Final   ??? Cholesterol, total 06/27/2018 264* <200 MG/DL Final   ??? Triglyceride 06/27/2018 104  <150 MG/DL Final   ??? HDL Cholesterol 06/27/2018 60  MG/DL Final   ??? LDL,  calculated 06/27/2018 183.2* 0 - 100 MG/DL Final   ??? VLDL, calculated 06/27/2018 20.8  MG/DL Final   ??? CHOL/HDL Ratio 06/27/2018 4.4  0.0 - 5.0   Final        RADIOLOGY REPORTS:  No results found for this or any previous visit.No results found.           MEDICATIONS       ALL MEDICATIONS  Current Facility-Administered Medications   Medication Dose Route Frequency   ??? QUEtiapine SR (SEROquel XR) tablet 300 mg  300 mg Oral QHS   ??? ziprasidone (GEODON) 20 mg in sterile water (preservative free) 1 mL injection  20 mg IntraMUSCular BID PRN   ??? OLANZapine (ZyPREXA) tablet 5 mg  5 mg Oral Q6H PRN   ??? benztropine (COGENTIN) tablet 2 mg  2 mg Oral BID PRN   ??? benztropine (COGENTIN) injection 2 mg  2 mg IntraMUSCular BID PRN   ??? LORazepam (ATIVAN) injection 2 mg  2 mg IntraMUSCular Q4H PRN   ??? acetaminophen (TYLENOL) tablet 650 mg  650 mg Oral Q4H PRN   ??? magnesium hydroxide (MILK OF MAGNESIA) 400 mg/5 mL oral suspension 30 mL  30 mL Oral DAILY PRN   ??? nicotine (NICODERM CQ) 21 mg/24 hr patch 1 Patch  1 Patch TransDERmal DAILY PRN   ??? hydrOXYzine HCl (ATARAX) tablet 50 mg  50 mg Oral Q6H PRN   ??? traZODone (DESYREL) tablet 50 mg  50 mg Oral QHS PRN      SCHEDULED MEDICATIONS  Current Facility-Administered Medications   Medication Dose Route Frequency   ??? QUEtiapine SR (SEROquel XR) tablet 300 mg  300 mg Oral QHS                ASSESSMENT & PLAN        The patient, Darey Hershberger Bagot, is a 22 y.o.  male who presents at this time for treatment of the following diagnoses:  Patient Active Hospital Problem List:   Schizoaffective disorder (HCC) (06/26/2018)    Assessment: Presents distractible, denies current AVH, paranoid delusions reported at the time of ER eval.    Plan:   - CONTINUE Seroquel XR 300 mg QHS for psychosis  - Discharge planning (rehab vs home)         A coordinated, multidisplinary treatment team (includes the nurse, unit pharmcist, Administrator) round was conducted for this initial evaluation with the patient  present.     The following regarding medications was addressed during rounds with patient:   the risks and benefits of the proposed medication. The patient was given the opportunity to ask questions. Informed consent given to the use of the above medications.     I will continue to adjust psychiatric and non-psychiatric medications (see above "medication" section and orders section for details) as deemed appropriate & based upon diagnoses and response to treatment.     I have reviewed admission (and previous/old) labs and medical tests in the EHR and or transferring hospital documents. I will continue to order blood tests/labs and diagnostic tests as deemed appropriate and review results as they become available (see orders for details).    I have reviewed old psychiatric and medical records available in the EHR. I Will order additional psychiatric records from other institutions to further elucidate the nature of patient's psychopathology and review once available.    I will gather additional collateral information from friends, family and o/p treatment team to further elucidate the nature of patient's psychopathology and baselline level of psychiatric functioning.      ESTIMATED LENGTH OF STAY:  07/05/2018       STRENGTHS:  Access to housing/residential stability, Interpersonal/supportive relationships (family, friends, peers) and Awareness of Substance abuse issues                                        SIGNED:    Lorie Phenix, MD  07/05/2018

## 2018-07-05 MED FILL — TRAZODONE 50 MG TAB: 50 mg | ORAL | Qty: 1

## 2018-07-05 MED FILL — SEROQUEL XR 300 MG TABLET,EXTENDED RELEASE: 300 mg | ORAL | Qty: 1

## 2018-07-05 NOTE — Progress Notes (Addendum)
0714 Kenneth Hubbard RN received report from Candee RN.    0835 Pt presents with a cooperative attitude, flat affect, euthymic mood. Pt denied SI, HI, AVH. Pt med and meal compliant. Pt keeping to self. Pt interacting well with staff.    1030 Pt actively attending coping skills group.    1640 Pt discharged from unit. Pt signed and verbalized understanding of discharge instructions. Pt received prescriptions to go home with.

## 2018-07-05 NOTE — Behavioral Health Treatment Team (Signed)
1900 Assumed care of patient  2000 In day area watching TV.  2130 Medication compliant  2145 in bed and appears to be sleeping.  0000 Continues to appear to be sleeping.  0300 Continues to appear to be sleeping  0600 Has slept ~ 8 hours this shift. Hourly rounds and Q 15 minute rounds continue

## 2018-07-05 NOTE — Discharge Summary (Signed)
PSYCHIATRIC DISCHARGE SUMMARY         IDENTIFICATION:    Patient Name  Kenneth Hill   Date of Birth 09-11-1996   CSN [id number]   Medical Record Number  [id number]      Age  22 y.o.   PCP None   Admit date:  06/26/2018    Discharge date: 07/05/2018   Room Number  327/01  @ [facility]   Date of Service  07/05/2018            TYPE OF DISCHARGE: REGULAR               CONDITION AT DISCHARGE: fair and stable       PROVISIONAL & DISCHARGE DIAGNOSES:    Problem List  Never Reviewed          Codes Class    * (Principal) Schizoaffective disorder ([place]) ICD-10-CM: F25.9  ICD-9-CM: 295.70               Active Hospital Problems    *Schizoaffective disorder ([place])        DISCHARGE DIAGNOSIS:   Axis I:  SEE ABOVE  Axis II: SEE ABOVE  Axis III: SEE ABOVE  Axis IV:  lack of structure  Axis V:  10 on admission, 55 on discharge     CC & HISTORY OF PRESENT ILLNESS:  "psychosis"    The patient, Kenneth Hill, is a 22 y.o.  [place] male with a past psychiatric history significant for Psychosis, who presents at this time with complaints of (and/or evidence of) the following emotional symptoms: agitation, delusions, paranoid behavior, anxiety and psychosis.  Additional symptomatology include anxiety, fearfulness, poor concentration and problem with medication.  The above symptoms have been present for a week. These symptoms are of high severity. These symptoms are constant and intermittent/ fleeting in nature.  The patient's condition has been precipitated by multiple psychosocial stressors .  Patient's condition made worse by continued illicit drug use as well as treatment noncompliance. UDS: +MJ, Amphetamines; BAL=0.   Pt is a [place] male and just returned from [place]. He presents very distractible. He has been on various anti-psychotics in the past and refuses to start one on. Reluctantly agreed to a trial of Seroquel.  7/7-Appears less anxious today but is very doubtful about taking Psych  meds. Still has delusional thoughts. Slept 4 hrs. Prn used Atarax. Mom visited yesterday.  7/8- patient has been visible, continues to be grossly disorganized. Patient with ongoing delusions per nursing; tolerating medication thus far. Denies SI/HI/AVH on interview. Endorses MJ use.  7/9- no acute overnight  Events. Patient isolative, odd. He states that Seroquel is too strong. Slept 8.75 hours. Patient denies AVH but is noted to be internally preoccupied. Discharge focused on interview. Patient amenable to starting XR formulation of Seroquel for ongoing symptom control though he is wary of the higher dose.  7/10- no acute overnight events, patient visited by family, had a verbal altercation with brother who left afterwards per nursing. Patient medication compliant, complains of sedation from Seroquel but otherwise is more coherent. Discharge focused, no complaints. Still disorganized and grossly internally preoccupied.  7/11- patient muted, blunted. Out for meals and isolative to room otherwise. Patient discharge focused, medication compliant still c/o sedation. Slept 8.25 hours.   7/12- no acute overnight events. Patient has been visible, no agitation noted. Patient still muted, discharge focused. Family is concerned that he may relapse shortly after discharge, want rehab for him.  7/13-  Reports feeling well and moods are good. Poverty of content of speech but appropriately responsive. Denies SI/HI/AH/VH.  No aggression or violence.  Appropriately interactive and aware. Tolerating medications well.  Eating and sleeping fairly.  7/14-   reports feeling well and moods are good.  Denies SI/HI/AH/VH.  No aggression or violence.  Appropriately interactive and aware. Tolerating medications well.  Eating and sleeping fairly.  Requesting to leave tomorrow  ??         SOCIAL HISTORY:    Social History     Socioeconomic History   ??? Marital status: SINGLE     Spouse name: Not on file   ??? Number of children: Not on file    ??? Years of education: Not on file   ??? Highest education level: Not on file   Occupational History   ??? Not on file   Social Needs   ??? Financial resource strain: Not on file   ??? Food insecurity:     Worry: Not on file     Inability: Not on file   ??? Transportation needs:     Medical: Not on file     Non-medical: Not on file   Tobacco Use   ??? Smoking status: Not on file   Substance and Sexual Activity   ??? Alcohol use: Not on file   ??? Drug use: Not on file   ??? Sexual activity: Not on file   Lifestyle   ??? Physical activity:     Days per week: Not on file     Minutes per session: Not on file   ??? Stress: Not on file   Relationships   ??? Social connections:     Talks on phone: Not on file     Gets together: Not on file     Attends religious service: Not on file     Active member of club or organization: Not on file     Attends meetings of clubs or organizations: Not on file     Relationship status: Not on file   ??? Intimate partner violence:     Fear of current or ex partner: Not on file     Emotionally abused: Not on file     Physically abused: Not on file     Forced sexual activity: Not on file   Other Topics Concern   ??? Not on file   Social History Narrative   ??? Not on file      FAMILY HISTORY:   No family history on file.          HOSPITALIZATION COURSE:    Kenneth Hill was admitted to the inpatient psychiatric unit [facility] for acute psychiatric stabilization in regards to symptomatology as described in the HPI above. The differential diagnosis at time of admission included: schizophrenia vs schizoaffective disorder.  While on the unit Kenneth Hill was involved in individual, group, occupational and milieu therapy.  Psychiatric medications were adjusted during this hospitalization including Seroquel.   Kenneth Hill demonstrated a slow, but progressive improvement in overall condition.  Much of patient's initial presentation appeared to be related to  situational stressors, effects of medication non-compliance, drugs of abuse, and psychological factors.  Please see individual progress notes for more specific details regarding patient's hospitalization course.     The patient was stabilized on Seroquel and was to be screened for inpatient rehab which was the recommendation of the treatment team and his family, but was reluctant to  go despite acknowledge having used marijuana and methamphetamine prior to admission. He minimized his substance use and was discharge focused though he remained medication compliant throughout the admission.    At time of discharge, Kenneth Hill is without significant problems of depression, psychosis, or mania. Patient free of suicidal and homicidal ideations (appears to be at very low risk of suicide or homicide) and reports many positive predictive factors in terms of not attempting suicide or homicide. Overall presentation at time of discharge is most consistent with the diagnosis of schizoaffective disorder.    Patient has maximized benefit to be derived from acute inpatient psychiatric treatment.  All members of the treatment team concur with each other in regards to plans for discharge today. Patient and family are aware and in agreement with discharge and discharge plan.         LABS AND IMAGAING:    Labs Reviewed   TSH 3RD GENERATION - Abnormal; Notable for the following components:       Result Value    TSH 6.43 (*)     All other components within normal limits   LIPID PANEL - Abnormal; Notable for the following components:    Cholesterol, total 264 (*)     LDL, calculated 183.2 (*)     All other components within normal limits   GLUCOSE, FASTING     No results found for: DS35, PHEN, PHENO, PHENT, DILF, DS39, PHENY, PTN, VALF2, VALAC, VALP, VALPR, DS6, CRBAM, CRBAMP, CARB2, XCRBAM  Admission on 06/26/2018   Component Date Value Ref Range Status   ??? Glucose 06/27/2018 98  65 - 100 MG/DL Final    ??? TSH 06/27/2018 6.43* 0.36 - 3.74 uIU/mL Final   ??? LIPID PROFILE 06/27/2018        Final   ??? Cholesterol, total 06/27/2018 264* <200 MG/DL Final   ??? Triglyceride 06/27/2018 104  <150 MG/DL Final   ??? HDL Cholesterol 06/27/2018 60  MG/DL Final   ??? LDL, calculated 06/27/2018 183.2* 0 - 100 MG/DL Final   ??? VLDL, calculated 06/27/2018 20.8  MG/DL Final   ??? CHOL/HDL Ratio 06/27/2018 4.4  0.0 - 5.0   Final     No results found.                DISPOSITION:    Home. Patient to f/u with drug/etoh rehabilitation, psychiatric, and psychotherapy appointments. Patient is to f/u with internist as directed.               FOLLOW-UP CARE:    Activity as tolerated  Regular diet  Wound Care: none needed.  Follow-up Information     Follow up With Specialties Details Why Contact Info    [place] to Please go to [facility] in intake appointments to begin process of receiving mental health medication, case management [place] [address]  Phone: [phone]  Fax: [phone]  Monday - Wednesday 7:30AM-3:00PM  Thursday 7:30AM-5:00PM  Friday 7:30AM-11:00AM      [place]   Intake appointment for services.  Due to the limited slots, you may want to arrive by 7:00 AM.  The doors open at 7:30 AM and you should sign in at the registration desk.  [place]  Hours: Monday-Friday 8:30AM- 12:00PM & 1:00PM-4:30PM   PHONE: [phone]   FAX: [phone]     Narcotics Anonymous Meeting List   In your  discharge paperwork we have provided July 2019 [facility] meetings list. [email or web address]    CALL 24 Hr: [place]   Linton Flemings is the treatment consultant you met with in the hospital.  Cell: [phone]                 PROGNOSIS:   Guarded / Poor---- based on nature of patient's pathology/ies and treatment compliance issues.  Prognosis is greatly dependent upon  patient's ability to remain sober and to follow up with scheduled appointments as well as to comply with psychiatric medications as prescribed.            DISCHARGE MEDICATIONS:     Informed consent given for the use of following psychotropic medications:  Current Discharge Medication List      START taking these medications    Details   QUEtiapine SR (SEROQUEL XR) 300 mg sr tablet Take 1 Tab by mouth nightly. Indications: Schizophrenia  Qty: 14 Tab, Refills: 1                    A coordinated, multidisplinary treatment team round was conducted with Nathaniel Man Guild---this is done daily here at [facility]. This team consists of the nurse, psychiatric unit pharmacist, Catering manager.     I have spent greater than 35 minutes on discharge work.    Signed:  Raeford Razor, MD  07/05/2018

## 2018-07-05 NOTE — Behavioral Health Treatment Team (Signed)
Behavioral Health Transition Record to Provider    Patient Name: Kenneth Hill  Date of Birth: 1996-11-03  Medical Record Number: [id number]  Date of Admission: 06/26/2018  Date of Discharge: 07/05/2018    Attending Provider: Raeford Razor, *  Discharging Provider: Clint Guy, MD  To contact this individual call [phone]  and ask the operator to page.  If unavailable, ask to be transferred to [facility] Provider on call.  [place] Provider will be available on call 24/7 and during holidays.    Primary Care Provider: None    Allergies   Allergen Reactions   ??? Aspirin Swelling       Reason for Admission: Psychosis     Admission Diagnosis: Delusional disorder ([place]) [F22]  Delusional disorder ([place]) [F22]    * No surgery found *    Results for orders placed or performed during the hospital encounter of 06/26/18   GLUCOSE, FASTING   Result Value Ref Range    Glucose 98 65 - 100 MG/DL   TSH 3RD GENERATION   Result Value Ref Range    TSH 6.43 (H) 0.36 - 3.74 uIU/mL   LIPID PANEL   Result Value Ref Range    LIPID PROFILE          Cholesterol, total 264 (H) <200 MG/DL    Triglyceride 104 <150 MG/DL    HDL Cholesterol 60 MG/DL    LDL, calculated 183.2 (H) 0 - 100 MG/DL    VLDL, calculated 20.8 MG/DL    CHOL/HDL Ratio 4.4 0.0 - 5.0         Immunizations administered during this encounter:   There is no immunization history on file for this patient.    Screening for Metabolic Disorders for Patients on Antipsychotic Medications  (Data obtained from the EMR)    Estimated Body Mass Index  Estimated body mass index is 22.6 kg/m?? as calculated from the following:    Height as of this encounter: _0  (1.676 m).    Weight as of this encounter: 63.5 kg (140 lb).     Vital Signs/Blood Pressure  Visit Vitals  BP 123/71 (BP 1 Location: Right arm, BP Patient Position: Sitting)   Pulse 94   Temp 98.3 ??F (36.8 ??C)   Resp 16   Ht _1  (1.676 m)   Wt 63.5 kg (140 lb)   SpO2 100%   BMI 22.60 kg/m??        Blood Glucose/Hemoglobin A1c  Lab Results   Component Value Date/Time    Glucose 98 06/27/2018 05:06 AM     No results found for: HBA1C, HGBE8, HBA1CEXT     Lipid Panel  Lab Results   Component Value Date/Time    Cholesterol, total 264 (H) 06/27/2018 05:06 AM    HDL Cholesterol 60 06/27/2018 05:06 AM    LDL, calculated 183.2 (H) 06/27/2018 05:06 AM    Triglyceride 104 06/27/2018 05:06 AM    CHOL/HDL Ratio 4.4 06/27/2018 05:06 AM        Discharge Diagnosis: Please refer to physician's discharge summary.     Discharge Plan: Pt was discharged and transported home by cab due to no family with transportation today to assist.  Pt met with american addiction consultant Linton Flemings to discuss inpatient rehab facilities but pt remains firm on not wanting inpatient treatment and says he does not have a substance problem.  Pt denies SI/HI/AH. Pt's thought process is logical but has zero insight into substance abuse issue.  Pt's judgement and insight remain poor.  Pt was provided resources for outpatient substance abuse, NA groups and inpatient contacts.   Family was updated with pt's plan and the resources available.       Discharge Medication List and Instructions:   Current Discharge Medication List      START taking these medications    Details   QUEtiapine SR (SEROQUEL XR) 300 mg sr tablet Take 1 Tab by mouth nightly. Indications: Schizophrenia  Qty: 14 Tab, Refills: 1             Unresulted Labs (24h ago, onward)    None        To obtain results of studies pending at discharge, please contact N/A.     Follow-up Information     Follow up With Specialties Details Why Contact Info    [place] to Please go to [facility] in intake appointments to begin process of receiving mental health medication, case management and [place] [address]  Phone: [phone]  Fax: [phone]  Monday - Wednesday 7:30AM-3:00PM  Thursday 7:30AM-5:00PM  Friday 7:30AM-11:00AM       [place]   Intake appointment for services.  Due to the limited slots, you may want to arrive by 7:00 AM.  The doors open at 7:30 AM and you should sign in at the registration desk.  [facility]  Hours: Monday-Friday 8:30AM- 12:00PM & 1:00PM-4:30PM   PHONE: [phone]   FAX: [phone]     Narcotics Anonymous Meeting List   In your discharge paperwork we have provided July 2019 [facility] meetings list. [facility] [email or web address]    CALL 24 Hr: [phone]      [place]   Linton Flemings is the treatment consultant you met with in the hospital.  Cell: [phone]          Advanced Directive:   Does the patient have an appointed surrogate decision maker? Unknown   Does the patient have a Medical Advance Directive? Unknown   Does the patient have a Psychiatric Advance Directive? Unknown   If the patient does not have a surrogate or Medical Advance Directive AND Psychiatric Advance Directive, the patient was offered information on these advance directives.  Unknown        Patient Instructions: Please continue all medications until otherwise directed by physician.      Tobacco Cessation Discharge Plan:   Is the patient a smoker and needs referral for smoking cessation? No  Patient referred to the following for smoking cessation with an appointment? No   Patient was offered medication to assist with smoking cessation at discharge? No  Was education for smoking cessation added to the discharge instructions? No     Alcohol/Substance Abuse Discharge Plan:   Does the patient have a history of substance/alcohol abuse and requires a referral for treatment? Yes  Patient referred to the following for substance/alcohol abuse treatment with an appointment? Yes  Patient was offered medication to assist with alcohol cessation at discharge? No  Was education for substance/alcohol abuse added to discharge instructions? Yes      Patient discharged to Home; provided to the patient/caregiver either in hard copy or electronically.  Continuing care paperwork was faxed to community mental health providers.

## 2018-07-05 NOTE — Discharge Summary (Signed)
PSYCHIATRIC DISCHARGE SUMMARY         IDENTIFICATION:    Patient Name  Kenneth Hill   Date of Birth [birth year]   CSN [id number]   Medical Record Number  [id number]      Age  22 y.o.   PCP None   Admit date:  06/26/2018    Discharge date: 07/05/2018   Room Number  327/01  @ [facility]   Date of Service  07/05/2018            TYPE OF DISCHARGE: REGULAR               CONDITION AT DISCHARGE: fair and stable       PROVISIONAL & DISCHARGE DIAGNOSES:    Problem List  Never Reviewed          Codes Class    * (Principal) Schizoaffective disorder ([place]) ICD-10-CM: F25.9  ICD-9-CM: 295.70               Active Hospital Problems    *Schizoaffective disorder ([place])        DISCHARGE DIAGNOSIS:   Axis I:  SEE ABOVE  Axis II: SEE ABOVE  Axis III: SEE ABOVE  Axis IV:  lack of structure  Axis V:  10 on admission, 55 on discharge     CC & HISTORY OF PRESENT ILLNESS:  "psychosis"    The patient, Kenneth Hill, is a 22 y.o.  [place] male with a past psychiatric history significant for Psychosis, who presents at this time with complaints of (and/or evidence of) the following emotional symptoms: agitation, delusions, paranoid behavior, anxiety and psychosis.  Additional symptomatology include anxiety, fearfulness, poor concentration and problem with medication.  The above symptoms have been present for a week. These symptoms are of high severity. These symptoms are constant and intermittent/ fleeting in nature.  The patient's condition has been precipitated by multiple psychosocial stressors .  Patient's condition made worse by continued illicit drug use as well as treatment noncompliance. UDS: +MJ, Amphetamines; BAL=0.   Pt is a [place] male and just returned from [place]. He presents very distractible. He has been on various anti-psychotics in the past and refuses to start one on. Reluctantly agreed to a trial of Seroquel.  7/7-Appears less anxious today but is very doubtful about taking Psych meds. Still has delusional  thoughts. Slept 4 hrs. Prn used Atarax. Mom visited yesterday.  7/8- patient has been visible, continues to be grossly disorganized. Patient with ongoing delusions per nursing; tolerating medication thus far. Denies SI/HI/AVH on interview. Endorses MJ use.  7/9- no acute overnight  Events. Patient isolative, odd. He states that Seroquel is too strong. Slept 8.75 hours. Patient denies AVH but is noted to be internally preoccupied. Discharge focused on interview. Patient amenable to starting XR formulation of Seroquel for ongoing symptom control though he is wary of the higher dose.  7/10- no acute overnight events, patient visited by family, had a verbal altercation with brother who left afterwards per nursing. Patient medication compliant, complains of sedation from Seroquel but otherwise is more coherent. Discharge focused, no complaints. Still disorganized and grossly internally preoccupied.  7/11- patient muted, blunted. Out for meals and isolative to room otherwise. Patient discharge focused, medication compliant still c/o sedation. Slept 8.25 hours.   7/12- no acute overnight events. Patient has been visible, no agitation noted. Patient still muted, discharge focused. Family is concerned that he may relapse shortly after discharge, want rehab for him.  7/13-  Reports feeling well and moods are good. Poverty of content of speech but appropriately responsive. Denies SI/HI/AH/VH.  No aggression or violence.  Appropriately interactive and aware. Tolerating medications well.  Eating and sleeping fairly.  7/14-   reports feeling well and moods are good.  Denies SI/HI/AH/VH.  No aggression or violence.  Appropriately interactive and aware. Tolerating medications well.  Eating and sleeping fairly.  Requesting to leave tomorrow  ??         SOCIAL HISTORY:    Social History     Socioeconomic History   ??? Marital status: SINGLE     Spouse name: Not on file   ??? Number of children: Not on file   ??? Years of education: Not on  file   ??? Highest education level: Not on file   Occupational History   ??? Not on file   Social Needs   ??? Financial resource strain: Not on file   ??? Food insecurity:     Worry: Not on file     Inability: Not on file   ??? Transportation needs:     Medical: Not on file     Non-medical: Not on file   Tobacco Use   ??? Smoking status: Not on file   Substance and Sexual Activity   ??? Alcohol use: Not on file   ??? Drug use: Not on file   ??? Sexual activity: Not on file   Lifestyle   ??? Physical activity:     Days per week: Not on file     Minutes per session: Not on file   ??? Stress: Not on file   Relationships   ??? Social connections:     Talks on phone: Not on file     Gets together: Not on file     Attends religious service: Not on file     Active member of club or organization: Not on file     Attends meetings of clubs or organizations: Not on file     Relationship status: Not on file   ??? Intimate partner violence:     Fear of current or ex partner: Not on file     Emotionally abused: Not on file     Physically abused: Not on file     Forced sexual activity: Not on file   Other Topics Concern   ??? Not on file   Social History Narrative   ??? Not on file      FAMILY HISTORY:   No family history on file.          HOSPITALIZATION COURSE:    Kenneth Hill was admitted to the inpatient psychiatric unit [facility] for acute psychiatric stabilization in regards to symptomatology as described in the HPI above. The differential diagnosis at time of admission included: schizophrenia vs schizoaffective disorder.  While on the unit Kenneth Hill was involved in individual, group, occupational and milieu therapy.  Psychiatric medications were adjusted during this hospitalization including Seroquel.   Kenneth Hill demonstrated a slow, but progressive improvement in overall condition.  Much of patient's initial presentation appeared to be related to situational stressors, effects of medication non-compliance, drugs of abuse, and  psychological factors.  Please see individual progress notes for more specific details regarding patient's hospitalization course.     The patient was stabilized on Seroquel and was to be screened for inpatient rehab which was the recommendation of the treatment team and his family, but was reluctant to  go despite acknowledge having used marijuana and methamphetamine prior to admission. He minimized his substance use and was discharge focused though he remained medication compliant throughout the admission.    At time of discharge, Kenneth Hill is without significant problems of depression, psychosis, or mania. Patient free of suicidal and homicidal ideations (appears to be at very low risk of suicide or homicide) and reports many positive predictive factors in terms of not attempting suicide or homicide. Overall presentation at time of discharge is most consistent with the diagnosis of schizoaffective disorder.    Patient has maximized benefit to be derived from acute inpatient psychiatric treatment.  All members of the treatment team concur with each other in regards to plans for discharge today. Patient and family are aware and in agreement with discharge and discharge plan.         LABS AND IMAGAING:    Labs Reviewed   TSH 3RD GENERATION - Abnormal; Notable for the following components:       Result Value    TSH 6.43 (*)     All other components within normal limits   LIPID PANEL - Abnormal; Notable for the following components:    Cholesterol, total 264 (*)     LDL, calculated 183.2 (*)     All other components within normal limits   GLUCOSE, FASTING     No results found for: DS35, PHEN, PHENO, PHENT, DILF, DS39, PHENY, PTN, VALF2, VALAC, VALP, VALPR, DS6, CRBAM, CRBAMP, CARB2, XCRBAM  Admission on 06/26/2018   Component Date Value Ref Range Status   ??? Glucose 06/27/2018 98  65 - 100 MG/DL Final   ??? TSH 06/27/2018 6.43* 0.36 - 3.74 uIU/mL Final   ??? LIPID PROFILE 06/27/2018        Final   ??? Cholesterol, total  06/27/2018 264* <200 MG/DL Final   ??? Triglyceride 06/27/2018 104  <150 MG/DL Final   ??? HDL Cholesterol 06/27/2018 60  MG/DL Final   ??? LDL, calculated 06/27/2018 183.2* 0 - 100 MG/DL Final   ??? VLDL, calculated 06/27/2018 20.8  MG/DL Final   ??? CHOL/HDL Ratio 06/27/2018 4.4  0.0 - 5.0   Final     No results found.                DISPOSITION:    Home. Patient to f/u with drug/etoh rehabilitation, psychiatric, and psychotherapy appointments. Patient is to f/u with internist as directed.               FOLLOW-UP CARE:    Activity as tolerated  Regular diet  Wound Care: none needed.  Follow-up Information     Follow up With Specialties Details Why Contact Info    [place] to Please go to [facility] Walk in intake appointments to begin process of receiving mental health medication, case management [place] [address]  Phone: [phone]  Fax: [phone]  Monday - Wednesday 7:30AM-3:00PM  Thursday 7:30AM-5:00PM  Friday 7:30AM-11:00AM      [place]   Intake appointment for services.  Due to the limited slots, you may want to arrive by 7:00 AM.  The doors open at 7:30 AM and you should sign in at the registration desk.  [facility]unter Loyal  Hours: Monday-Friday 8:30AM- 12:00PM & 1:00PM-4:30PM   PHONE: [phone]   FAX: [phone]     Narcotics Anonymous Meeting List   In your  discharge paperwork we have provided July 2019 [facility] meetings list. [facility] [email or web address]    CALL 24 Hr: [place]   Linton Flemings is the treatment consultant you met with in the hospital.  Cell: [phone]                 PROGNOSIS:   Guarded / Poor---- based on nature of patient's pathology/ies and treatment compliance issues.  Prognosis is greatly dependent upon patient's ability to remain sober and to follow up with scheduled appointments as well as to comply with psychiatric medications as prescribed.            DISCHARGE  MEDICATIONS:     Informed consent given for the use of following psychotropic medications:  Current Discharge Medication List      START taking these medications    Details   QUEtiapine SR (SEROQUEL XR) 300 mg sr tablet Take 1 Tab by mouth nightly. Indications: Schizophrenia  Qty: 14 Tab, Refills: 1                    A coordinated, multidisplinary treatment team round was conducted with Nathaniel Man Bradly---this is done daily here at [facility]. This team consists of the nurse, psychiatric unit pharmacist, Catering manager.     I have spent greater than 35 minutes on discharge work.    Signed:  Raeford Razor, MD  07/05/2018

## 2018-07-05 NOTE — Behavioral Health Treatment Team (Signed)
 Behavioral Health Transition Record to Provider    Patient Name: Kenneth Hill  Date of Birth: September 11, 1996  Medical Record Number: [id number]  Date of Admission: 06/26/2018  Date of Discharge: 07/05/2018    Attending Provider: Debbie Prince CROME, *  Discharging Provider: Prince Debbie, MD  To contact this individual call [phone]  and ask the operator to page.  If unavailable, ask to be transferred to [facility] Provider on call.  A Behavioral Health Provider will be available on call 24/7 and during holidays.    Primary Care Provider: None    Allergies   Allergen Reactions   . Aspirin Swelling       Reason for Admission: Psychosis     Admission Diagnosis: Delusional disorder ([facility]) [F22]  Delusional disorder ([facility]) [F22]    * No surgery found *    Results for orders placed or performed during the hospital encounter of 06/26/18   GLUCOSE, FASTING   Result Value Ref Range    Glucose 98 65 - 100 MG/DL   TSH 3RD GENERATION   Result Value Ref Range    TSH 6.43 (H) 0.36 - 3.74 uIU/mL   LIPID PANEL   Result Value Ref Range    LIPID PROFILE          Cholesterol, total 264 (H) <200 MG/DL    Triglyceride 895 <849 MG/DL    HDL Cholesterol 60 MG/DL    LDL, calculated 816.7 (H) 0 - 100 MG/DL    VLDL, calculated 79.1 MG/DL    CHOL/HDL Ratio 4.4 0.0 - 5.0         Immunizations administered during this encounter:   There is no immunization history on file for this patient.    Screening for Metabolic Disorders for Patients on Antipsychotic Medications  (Data obtained from the EMR)    Estimated Body Mass Index  Estimated body mass index is 22.6 kg/m as calculated from the following:    Height as of this encounter: 5' 6 (1.676 m).    Weight as of this encounter: 63.5 kg (140 lb).     Vital Signs/Blood Pressure  Visit Vitals  BP 123/71 (BP 1 Location: Right arm, BP Patient Position: Sitting)   Pulse 94   Temp 98.3 F (36.8 C)   Resp 16   Ht 5' 6 (1.676 m)   Wt 63.5 kg (140 lb)   SpO2 100%   BMI 22.60 kg/m       Blood  Glucose/Hemoglobin A1c  Lab Results   Component Value Date/Time    Glucose 98 06/27/2018 05:06 AM       No results found for: HBA1C, HGBE8, HBA1CEXT     Lipid Panel  Lab Results   Component Value Date/Time    Cholesterol, total 264 (H) 06/27/2018 05:06 AM    HDL Cholesterol 60 06/27/2018 05:06 AM    LDL, calculated 183.2 (H) 92/92/7980 05:06 AM    Triglyceride 104 06/27/2018 05:06 AM    CHOL/HDL Ratio 4.4 06/27/2018 05:06 AM        Discharge Diagnosis: Please refer to physician's discharge summary.     Discharge Plan: Pt was discharged and transported home by cab due to no family with transportation today to assist.  Pt met with american addiction consultant Rockey Sarin to discuss inpatient rehab facilities but pt remains firm on not wanting inpatient treatment and says he does not have a substance problem.  Pt denies SI/HI/AH. Pt's thought process is logical but has zero insight into substance  abuse issue.  Pt's judgement and insight remain poor.  Pt was provided resources for outpatient substance abuse, NA groups and inpatient contacts.   Family was updated with pt's plan and the resources available.       Discharge Medication List and Instructions:   Current Discharge Medication List      START taking these medications    Details   QUEtiapine SR (SEROQUEL XR) 300 mg sr tablet Take 1 Tab by mouth nightly. Indications: Schizophrenia  Qty: 14 Tab, Refills: 1             Unresulted Labs (24h ago, onward)    None        To obtain results of studies pending at discharge, please contact N/A.     Follow-up Information     Follow up With Specialties Details Why Contact Info    [facility]  Go to Please go to [facility] Walk in intake appointments to begin process of receiving mental health medication, case management and therapy services. [address] [place], [place] [address]  Phone: [phone]  Fax: [phone]  Monday - Wednesday 7:30AM-3:00PM  Thursday 7:30AM-5:00PM  Friday 7:30AM-11:00AM      [facility]   Intake appointment for services.  Due to the limited slots, you may want to arrive by 7:00 AM.  The doors open at 7:30 AM and you should sign in at the registration desk.  [address]  [place]  [phone]  Hours: Monday-Friday 8:30AM- 12:00PM & 1:00PM-4:30PM   PHONE: [phone]   FAX: [phone]     Narcotics Anonymous Meeting List   In your discharge paperwork we have provided July 2019 [facility] meetings list. [facility] [email or web address]    CALL 24 Hr: [phone]      [facility]   Rockey Sarin is the treatment consultant you met with in the hospital.  Cell: [phone]          Advanced Directive:   Does the patient have an appointed surrogate decision maker? Unknown   Does the patient have a Medical Advance Directive? Unknown   Does the patient have a Psychiatric Advance Directive? Unknown   If the patient does not have a surrogate or Medical Advance Directive AND Psychiatric Advance Directive, the patient was offered information on these advance directives.  Unknown        Patient Instructions: Please continue all medications until otherwise directed by physician.      Tobacco Cessation Discharge Plan:   Is the patient a smoker and needs referral for smoking cessation? No  Patient referred to the following for smoking cessation with an appointment? No   Patient was offered medication to assist with smoking cessation at discharge? No  Was education for smoking cessation added to the discharge instructions? No     Alcohol/Substance Abuse Discharge Plan:   Does the patient have a history of substance/alcohol abuse and requires a referral for treatment? Yes  Patient referred to the following for substance/alcohol abuse treatment with an appointment? Yes  Patient was offered medication to assist with alcohol cessation at discharge? No  Was education for substance/alcohol abuse added to discharge instructions? Yes     Patient discharged to Home; provided to the patient/caregiver either  in hard copy or electronically.  Continuing care paperwork was faxed to community mental health providers.

## 2018-07-05 NOTE — Progress Notes (Signed)
[id number] Kenneth [name] RN [facility].    [phone] Pt presents with a cooperative attitude, flat affect, euthymic mood. Pt denied SI, HI, AVH. Pt med and meal compliant. Pt keeping to self. Pt interacting well with staff.    1030 Pt actively attending coping skills group.    1640 Pt discharged from unit. Pt signed and verbalized understanding of discharge instructions. Pt received prescriptions to go home with.

## 2018-07-05 NOTE — Behavioral Health Treatment Team (Signed)
1900 Assumed care of patient  2000 In day area watching TV.  2130 Medication compliant  2145 in bed and appears to be sleeping.  0000 Continues to appear to be sleeping.  0300 Continues to appear to be sleeping  0600 Has slept ~ 8 hours this shift. Hourly rounds and Q 15 minute rounds continue
